# Patient Record
Sex: Female | Born: 1970 | Race: White | Hispanic: No | Smoking: Current every day smoker
Health system: Southern US, Community
[De-identification: ages and names within clinical notes are randomized; demographics above are authoritative.]

## PROBLEM LIST (undated history)

## (undated) DIAGNOSIS — F429 Obsessive-compulsive disorder, unspecified: Secondary | ICD-10-CM

## (undated) DIAGNOSIS — Z8639 Personal history of other endocrine, nutritional and metabolic disease: Secondary | ICD-10-CM

## (undated) DIAGNOSIS — K589 Irritable bowel syndrome without diarrhea: Secondary | ICD-10-CM

## (undated) DIAGNOSIS — M869 Osteomyelitis, unspecified: Secondary | ICD-10-CM

## (undated) DIAGNOSIS — F329 Major depressive disorder, single episode, unspecified: Secondary | ICD-10-CM

## (undated) DIAGNOSIS — Z8661 Personal history of infections of the central nervous system: Secondary | ICD-10-CM

## (undated) HISTORY — DX: Osteomyelitis, unspecified: M86.9

## (undated) HISTORY — PX: CHOLECYSTECTOMY: SHX55

## (undated) HISTORY — DX: Irritable bowel syndrome, unspecified: K58.9

## (undated) HISTORY — PX: TONSILLECTOMY: SUR1361

## (undated) HISTORY — DX: Personal history of other endocrine, nutritional and metabolic disease: Z86.39

## (undated) HISTORY — DX: Major depressive disorder, single episode, unspecified: F32.9

## (undated) HISTORY — DX: Obsessive-compulsive disorder, unspecified: F42.9

## (undated) HISTORY — DX: Personal history of infections of the central nervous system: Z86.61

---

## 1997-08-24 HISTORY — PX: THYROIDECTOMY, PARTIAL: SHX18

## 2002-08-24 HISTORY — PX: OTHER SURGICAL HISTORY: SHX169

## 2005-08-24 HISTORY — PX: PARTIAL HYSTERECTOMY: SHX80

## 2006-12-22 ENCOUNTER — Ambulatory Visit (HOSPITAL_COMMUNITY): Payer: Self-pay | Admitting: Psychiatry

## 2007-01-21 ENCOUNTER — Ambulatory Visit (HOSPITAL_COMMUNITY): Payer: Self-pay | Admitting: Psychiatry

## 2007-04-28 ENCOUNTER — Ambulatory Visit (HOSPITAL_COMMUNITY): Payer: Self-pay | Admitting: Psychiatry

## 2007-06-06 ENCOUNTER — Ambulatory Visit (HOSPITAL_COMMUNITY): Payer: Self-pay | Admitting: Psychiatry

## 2007-07-07 ENCOUNTER — Ambulatory Visit (HOSPITAL_COMMUNITY): Payer: Self-pay | Admitting: Psychiatry

## 2007-08-12 ENCOUNTER — Ambulatory Visit (HOSPITAL_COMMUNITY): Payer: Self-pay | Admitting: Psychiatry

## 2007-09-23 ENCOUNTER — Ambulatory Visit (HOSPITAL_COMMUNITY): Payer: Self-pay | Admitting: Psychiatry

## 2007-12-08 ENCOUNTER — Ambulatory Visit (HOSPITAL_COMMUNITY): Payer: Self-pay | Admitting: Psychiatry

## 2008-03-08 ENCOUNTER — Ambulatory Visit (HOSPITAL_COMMUNITY): Payer: Self-pay | Admitting: Psychiatry

## 2008-06-19 ENCOUNTER — Encounter: Admission: RE | Admit: 2008-06-19 | Discharge: 2008-06-19 | Payer: Self-pay | Admitting: Family Medicine

## 2008-06-19 ENCOUNTER — Ambulatory Visit: Payer: Self-pay | Admitting: Family Medicine

## 2008-06-19 DIAGNOSIS — F429 Obsessive-compulsive disorder, unspecified: Secondary | ICD-10-CM | POA: Insufficient documentation

## 2008-06-19 DIAGNOSIS — R05 Cough: Secondary | ICD-10-CM

## 2008-06-19 DIAGNOSIS — G43909 Migraine, unspecified, not intractable, without status migrainosus: Secondary | ICD-10-CM | POA: Insufficient documentation

## 2008-06-19 DIAGNOSIS — R059 Cough, unspecified: Secondary | ICD-10-CM | POA: Insufficient documentation

## 2008-06-19 DIAGNOSIS — J069 Acute upper respiratory infection, unspecified: Secondary | ICD-10-CM | POA: Insufficient documentation

## 2008-06-20 ENCOUNTER — Encounter: Payer: Self-pay | Admitting: Family Medicine

## 2008-06-25 ENCOUNTER — Ambulatory Visit: Payer: Self-pay | Admitting: Occupational Medicine

## 2008-06-25 DIAGNOSIS — E860 Dehydration: Secondary | ICD-10-CM | POA: Insufficient documentation

## 2008-06-25 LAB — CONVERTED CEMR LAB
CO2: 22 meq/L (ref 19–32)
Chlamydia, Swab/Urine, PCR: NEGATIVE
Chloride: 107 meq/L (ref 96–112)
Creatinine, Ser: 1.2 mg/dL (ref 0.40–1.20)
GC Probe Amp, Urine: NEGATIVE
Glucose, Bld: 112 mg/dL — ABNORMAL HIGH (ref 70–99)
HCV Ab: NEGATIVE
Hemoglobin: 12.8 g/dL (ref 12.0–15.0)
Lymphocytes Relative: 4 % — ABNORMAL LOW (ref 12–46)
Lymphs Abs: 0.4 10*3/uL — ABNORMAL LOW (ref 0.7–4.0)
MCV: 91.9 fL (ref 78.0–100.0)
Potassium: 3.7 meq/L (ref 3.5–5.3)
Sodium: 133 meq/L — ABNORMAL LOW (ref 135–145)
WBC: 10 10*3/uL (ref 4.0–10.5)

## 2008-06-28 ENCOUNTER — Ambulatory Visit: Payer: Self-pay | Admitting: Family Medicine

## 2008-07-16 ENCOUNTER — Ambulatory Visit: Payer: Self-pay | Admitting: Family Medicine

## 2008-09-06 ENCOUNTER — Ambulatory Visit: Payer: Self-pay | Admitting: Family Medicine

## 2008-09-06 DIAGNOSIS — R109 Unspecified abdominal pain: Secondary | ICD-10-CM | POA: Insufficient documentation

## 2008-10-26 ENCOUNTER — Ambulatory Visit: Payer: Self-pay | Admitting: Family Medicine

## 2008-10-26 DIAGNOSIS — J209 Acute bronchitis, unspecified: Secondary | ICD-10-CM | POA: Insufficient documentation

## 2008-11-19 ENCOUNTER — Encounter: Admission: RE | Admit: 2008-11-19 | Discharge: 2008-11-19 | Payer: Self-pay | Admitting: Family Medicine

## 2008-11-19 ENCOUNTER — Ambulatory Visit: Payer: Self-pay | Admitting: Family Medicine

## 2008-11-19 DIAGNOSIS — E039 Hypothyroidism, unspecified: Secondary | ICD-10-CM | POA: Insufficient documentation

## 2008-11-19 DIAGNOSIS — I4949 Other premature depolarization: Secondary | ICD-10-CM | POA: Insufficient documentation

## 2008-11-19 DIAGNOSIS — R079 Chest pain, unspecified: Secondary | ICD-10-CM | POA: Insufficient documentation

## 2008-11-20 ENCOUNTER — Encounter: Payer: Self-pay | Admitting: Family Medicine

## 2008-11-21 LAB — CONVERTED CEMR LAB
Basophils Relative: 1 % (ref 0–1)
Eosinophils Absolute: 0.6 10*3/uL (ref 0.0–0.7)
Eosinophils Relative: 9 % — ABNORMAL HIGH (ref 0–5)
HDL: 83 mg/dL (ref >39)
Hemoglobin: 14 g/dL (ref 12.0–15.0)
LDL Cholesterol: 97 mg/dL (ref 0–99)
Lymphocytes Relative: 37 % (ref 12–46)
Lymphs Abs: 2.6 10*3/uL (ref 0.7–4.0)
MCHC: 34.1 g/dL (ref 30.0–36.0)
Monocytes Relative: 6 % (ref 3–12)
Platelets: 265 10*3/uL (ref 150–400)
TSH: 3.554 microintl units/mL (ref 0.350–4.500)
Total CHOL/HDL Ratio: 2.3
WBC: 7.1 10*3/uL (ref 4.0–10.5)

## 2008-11-28 ENCOUNTER — Ambulatory Visit: Payer: Self-pay | Admitting: Cardiology

## 2008-11-28 ENCOUNTER — Encounter: Payer: Self-pay | Admitting: Cardiology

## 2008-12-12 ENCOUNTER — Ambulatory Visit: Payer: Self-pay

## 2008-12-12 ENCOUNTER — Encounter: Payer: Self-pay | Admitting: Cardiology

## 2009-01-06 ENCOUNTER — Encounter: Payer: Self-pay | Admitting: Family Medicine

## 2009-03-11 ENCOUNTER — Telehealth: Payer: Self-pay | Admitting: Family Medicine

## 2009-03-11 ENCOUNTER — Encounter: Payer: Self-pay | Admitting: Family Medicine

## 2009-04-01 ENCOUNTER — Ambulatory Visit: Payer: Self-pay | Admitting: Family Medicine

## 2009-04-01 DIAGNOSIS — S62609A Fracture of unspecified phalanx of unspecified finger, initial encounter for closed fracture: Secondary | ICD-10-CM | POA: Insufficient documentation

## 2009-04-01 DIAGNOSIS — M546 Pain in thoracic spine: Secondary | ICD-10-CM | POA: Insufficient documentation

## 2009-06-27 ENCOUNTER — Encounter: Admission: RE | Admit: 2009-06-27 | Discharge: 2009-06-27 | Payer: Self-pay | Admitting: Family Medicine

## 2009-06-27 ENCOUNTER — Ambulatory Visit: Payer: Self-pay | Admitting: Family Medicine

## 2009-06-27 DIAGNOSIS — F17200 Nicotine dependence, unspecified, uncomplicated: Secondary | ICD-10-CM | POA: Insufficient documentation

## 2009-08-21 ENCOUNTER — Encounter: Payer: Self-pay | Admitting: Family Medicine

## 2009-09-27 ENCOUNTER — Encounter: Payer: Self-pay | Admitting: Family Medicine

## 2009-10-22 IMAGING — CR DG CHEST 2V
2 series · 2 of 2 positions shown · non-contrast
Comparison: 06/25/2008

CLINICAL DATA: Chest pain, shortness of breath and cough.

CHEST - 2 VIEW

[view not recorded (1 of 2)]
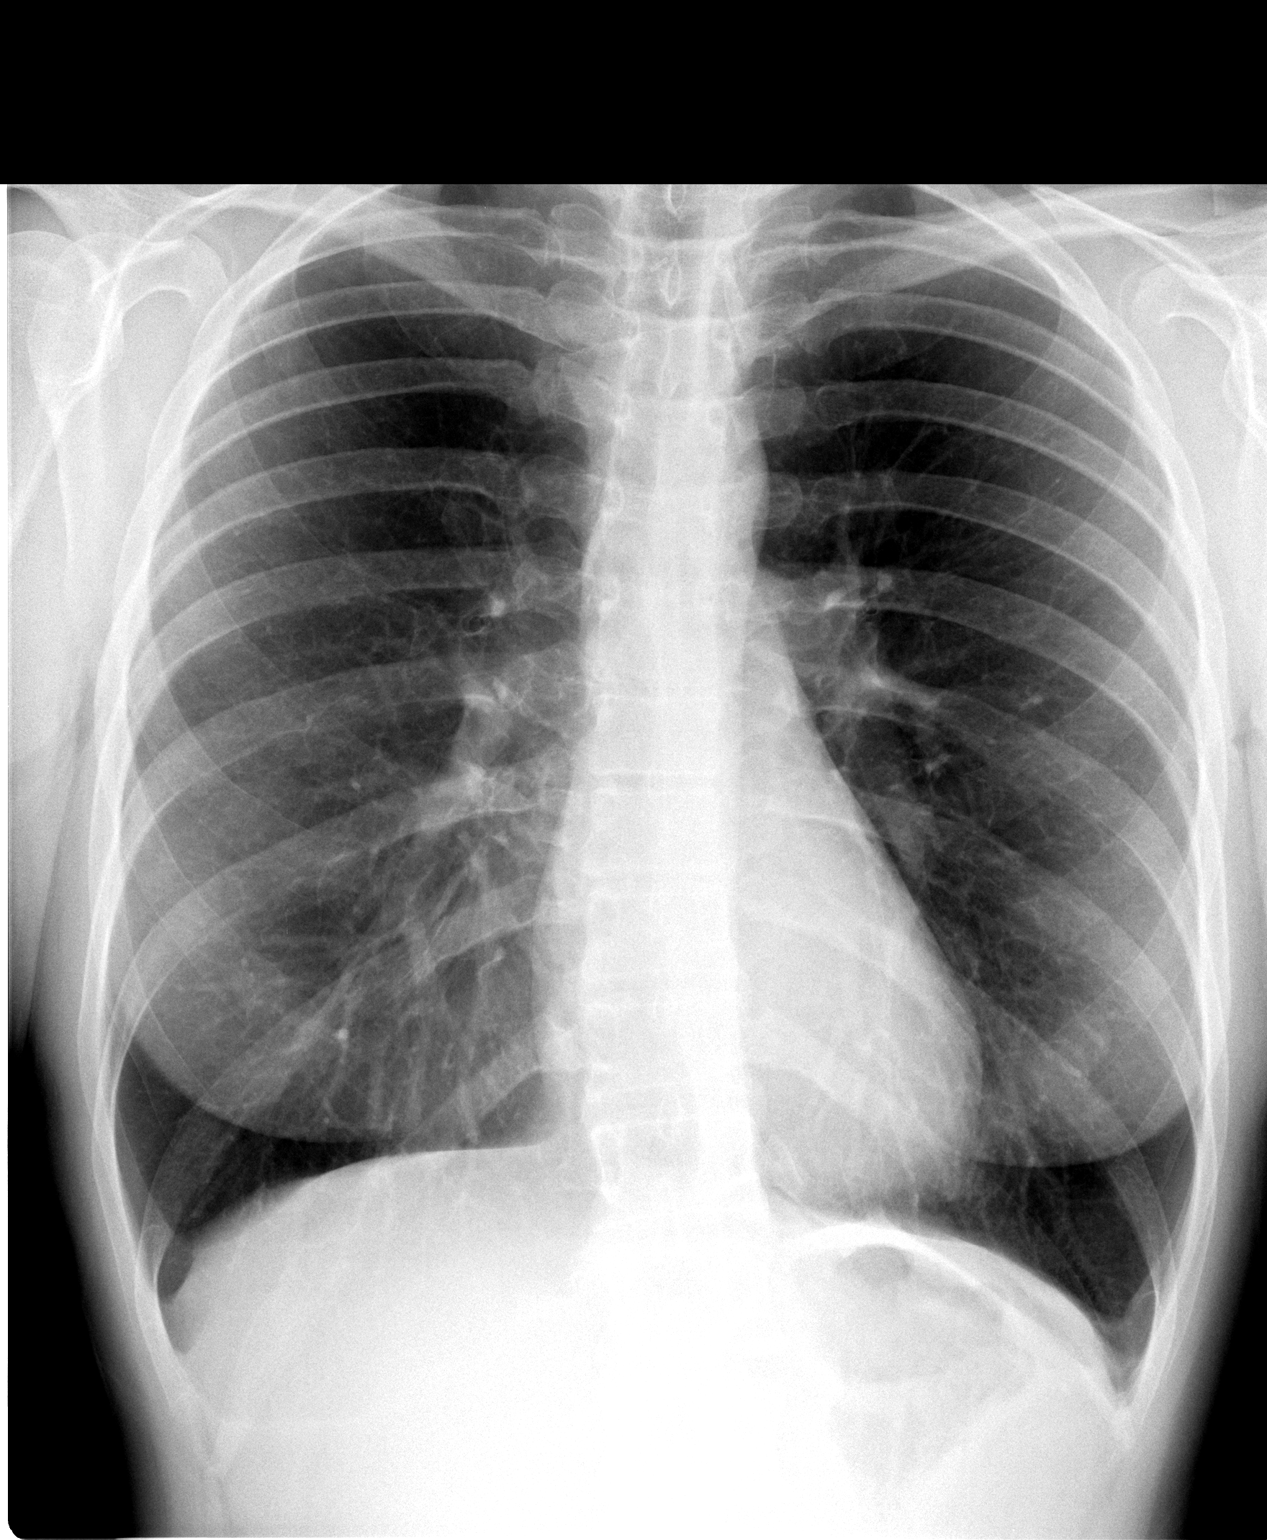

[view not recorded (2 of 2)]
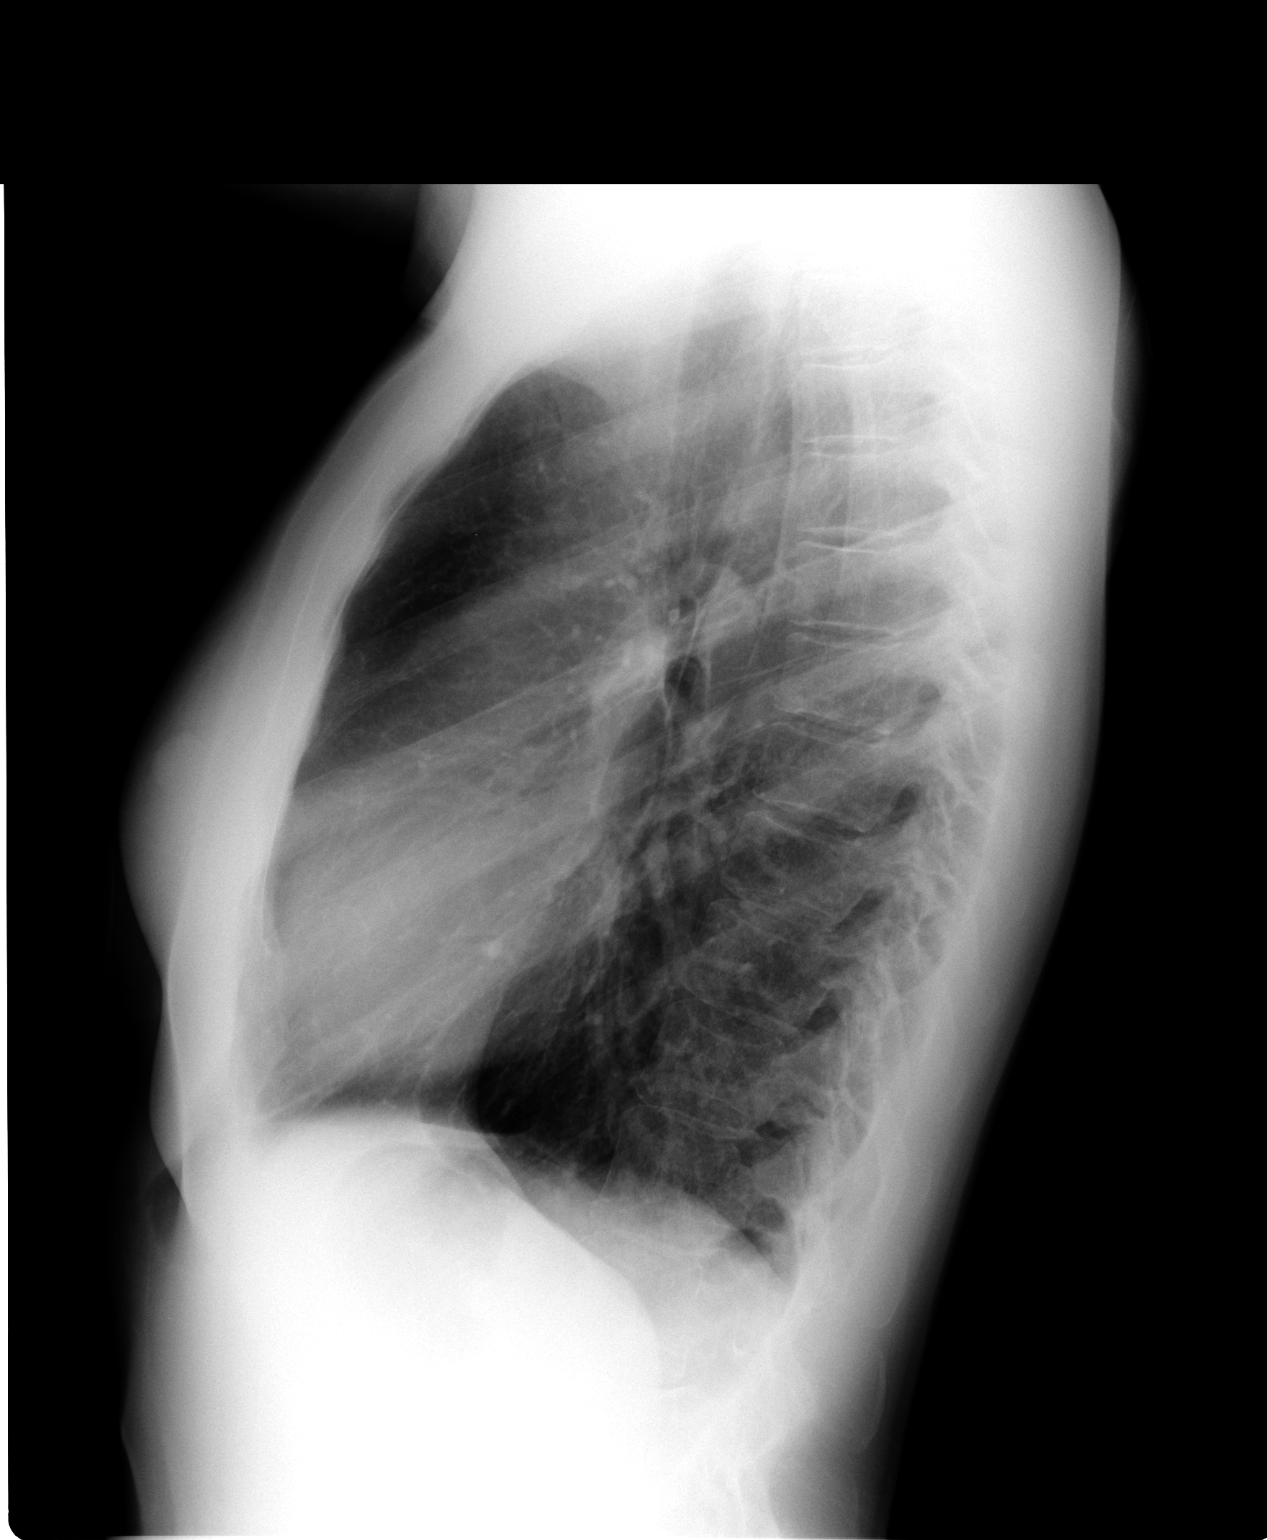

[2 of 2 positions shown; findings below may reference images not displayed]

FINDINGS: Trachea is midline.  Heart size normal.  Lungs are clear.
No pleural fluid.
IMPRESSION: No acute findings.

## 2009-12-25 ENCOUNTER — Encounter: Payer: Self-pay | Admitting: Family Medicine

## 2009-12-31 ENCOUNTER — Ambulatory Visit (HOSPITAL_COMMUNITY): Payer: Self-pay | Admitting: Psychiatry

## 2010-01-08 ENCOUNTER — Encounter: Payer: Self-pay | Admitting: Family Medicine

## 2010-02-13 ENCOUNTER — Ambulatory Visit (HOSPITAL_COMMUNITY): Payer: Self-pay | Admitting: Psychiatry

## 2010-03-21 ENCOUNTER — Ambulatory Visit: Payer: Self-pay | Admitting: Family Medicine

## 2010-03-21 DIAGNOSIS — M5430 Sciatica, unspecified side: Secondary | ICD-10-CM | POA: Insufficient documentation

## 2010-03-28 ENCOUNTER — Telehealth (INDEPENDENT_AMBULATORY_CARE_PROVIDER_SITE_OTHER): Payer: Self-pay | Admitting: *Deleted

## 2010-04-08 ENCOUNTER — Ambulatory Visit: Payer: Self-pay | Admitting: Family Medicine

## 2010-04-08 DIAGNOSIS — IMO0002 Reserved for concepts with insufficient information to code with codable children: Secondary | ICD-10-CM | POA: Insufficient documentation

## 2010-04-12 ENCOUNTER — Encounter: Admission: RE | Admit: 2010-04-12 | Discharge: 2010-04-12 | Payer: Self-pay | Admitting: Family Medicine

## 2010-04-21 ENCOUNTER — Ambulatory Visit: Payer: Self-pay | Admitting: Family Medicine

## 2010-04-21 DIAGNOSIS — R1031 Right lower quadrant pain: Secondary | ICD-10-CM | POA: Insufficient documentation

## 2010-04-21 LAB — CONVERTED CEMR LAB
Blood in Urine, dipstick: NEGATIVE
Glucose, Urine, Semiquant: NEGATIVE
HCT: 38.7 % (ref 36.0–46.0)
Hemoglobin: 13 g/dL (ref 12.0–15.0)
Ketones, urine, test strip: NEGATIVE
MCHC: 33.6 g/dL (ref 30.0–36.0)
Monocytes Relative: 7 % (ref 3–12)
Neutro Abs: 3.3 10*3/uL (ref 1.7–7.7)
Neutrophils Relative %: 54 % (ref 43–77)
Nitrite: NEGATIVE
Specific Gravity, Urine: >=1.03
WBC Urine, dipstick: NEGATIVE

## 2010-04-22 LAB — CONVERTED CEMR LAB
ALT: 9 units/L (ref 0–35)
Alkaline Phosphatase: 38 units/L — ABNORMAL LOW (ref 39–117)
BUN: 15 mg/dL (ref 6–23)
Calcium: 9 mg/dL (ref 8.4–10.5)
Chloride: 113 meq/L — ABNORMAL HIGH (ref 96–112)
Creatinine, Ser: 0.84 mg/dL (ref 0.40–1.20)
Total Bilirubin: 0.4 mg/dL (ref 0.3–1.2)
Total Protein: 6 g/dL (ref 6.0–8.3)

## 2010-04-23 ENCOUNTER — Telehealth: Payer: Self-pay | Admitting: Family Medicine

## 2010-05-16 ENCOUNTER — Ambulatory Visit (HOSPITAL_COMMUNITY): Payer: Self-pay | Admitting: Psychiatry

## 2010-06-25 ENCOUNTER — Ambulatory Visit (HOSPITAL_COMMUNITY): Payer: Self-pay | Admitting: Psychiatry

## 2010-07-29 ENCOUNTER — Ambulatory Visit (HOSPITAL_COMMUNITY): Payer: Self-pay | Admitting: Psychiatry

## 2010-09-08 ENCOUNTER — Encounter: Payer: Self-pay | Admitting: Family Medicine

## 2010-09-23 NOTE — Letter (Signed)
Summary: [facility]  [facility]   Imported By: Lanelle Bal 01/23/2010 10:43:51  _____________________________________________________________________  External Attachment:    Type:   Image     Comment:   External Document

## 2010-09-23 NOTE — Progress Notes (Signed)
Summary: pain medication  Phone Note Call from Patient   Caller: Patient Summary of Call: pt wants a refill of pain medication to get through the weekend. States she is going out of town Initial call taken by: Avon Gully CMA, Duncan Dull),  March 28, 2010 11:22 AM  Follow-up for Phone Call        This isnt my patient but I will refill her med for 20 more tab and then needs to see Dr. B back.  Follow-up by: Nani Gasser MD,  March 28, 2010 12:04 PM  Additional Follow-up for Phone Call Additional follow up Details #1::        Pt aware of the above    Prescriptions: HYDROCODONE-ACETAMINOPHEN 5-325 MG TABS (HYDROCODONE-ACETAMINOPHEN) 1-2 tabs by  mouth eveyr 4-6 hours as needed severe pain.  #20 x 0   Entered and Authorized by:   Nani Gasser MD   Signed by:   Nani Gasser MD on 03/28/2010   Method used:   Printed then faxed [facility] ...       [facility]  [address] [phone]* (retail)       [address] [place], [place]  [address]       Ph: [phone] or [phone]       Fax: [phone]   RxID:   [id number]

## 2010-09-23 NOTE — Assessment & Plan Note (Signed)
Summary: lumbar radiculopathy   Vital Signs:  Patient profile:   40 year old female Height:      70 inches Weight:      148 pounds BMI:     21.31 O2 Sat:      100 % on Room air Pulse rate:   90 / minute BP sitting:   107 / 75  (left arm) Cuff size:   regular  Vitals Entered By: Payton Spark CMA (April 08, 2010 10:05 AM)  O2 Flow:  Room air CC: F/U back and leg pain x 3 weeks.   Primary Care Provider:  Seymour Bars DO  CC:  F/U back and leg pain x 3 weeks.Marland Kitchen  History of Present Illness: 40 yo WF presents for f/u R sided sciatic x 3 wks which started after moving furniture.  She has not improved at all even with Vicodin, Aleve and Flexeril.  She has chronic LBP, had seen a chiropractor in the past and had Xrays done.  These wer normal other than scoliosis.  She has pain with sitting and driving.  Has radiating pain from the R buttock and hip all the way down to her foot.  Denies tingling.  Has some mild R foot numbness.  She is walking with an altered gait since this happened.  She is waking up at night with pain.      Current Medications (verified): 1)  Fluvoxamine Maleate 100 Mg Tabs (Fluvoxamine Maleate) .Marland Kitchen.. 1 Tab By Mouth Bid 2)  Topamax 100 Mg Tabs (Topiramate) .... Take 1&1/2 By Mouth Daily 3)  Levoxyl 50 Mcg Tabs (Levothyroxine Sodium) .Marland Kitchen.. 1 Tab By Mouth Daily 4)  Multivitamins  Tabs (Multiple Vitamin) .... Take 1 Tablet By Mouth Once A Day 5)  Wellbutrin Xl 150 Mg Xr24h-Tab (Bupropion Hcl) .Marland Kitchen.. 1 Tab By Mouth Once Daily 6)  Cyclobenzaprine Hcl 10 Mg Tabs (Cyclobenzaprine Hcl) .... Take 1 Tablet By Mouth Three Times A Day As Needed 7)  Hydrocodone-Acetaminophen 5-325 Mg Tabs (Hydrocodone-Acetaminophen) .Marland Kitchen.. 1-2 Tabs By  Mouth Eveyr 4-6 Hours As Needed Severe Pain.  Allergies (verified): 1)  ! Codeine 2)  ! * Avelox  Past History:  Past Medical History: Reviewed history from 11/28/2008 and no changes required. hx of hypothyroidism, off meds G3P2A1 OCD (Dr  Christell Constant) Migraines (Dr Catalina Lunger at the [facility]) gyn: Dr Darcey Nora H/O meningitis IBS  Past Surgical History: Reviewed history from 06/25/2008 and no changes required. partial thyroidectomy 1999 BTL 2004 partial TAH 2007 for prolapse Tonsillectomy  Social History: Reviewed history from 12/28/2008 and no changes required. 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 Smokes 1/2 ppd x 11 yrs. 1 ETOH/ wk. No regular exercise.  Review of Systems      See HPI  Physical Exam  General:  alert, well-developed, well-nourished, and well-hydrated.   Head:  normocephalic and atraumatic.   Neck:  supple.   Lungs:  Normal respiratory effort, chest expands symmetrically. Lungs are clear to auscultation, no crackles or wheezes. Heart:  normal rate, regular rhythm, and no murmur.   Msk:  tender at L 4-S1 R>L with sciatic notch tenderness.  pain with L spine flexion and extension.  + R sided seated leg raise with resisted strength on R hip flexion/ R leg extension Pulses:  2+ pedal pulses Extremities:  no LE edema Neurologic:  antagic gaitsensation intact to light touch and DTRs symmetrical and normal.   Skin:  color normal.  Impression & Recommendations:  Problem # 1:  LUMBAR RADICULOPATHY, RIGHT (ICD-724.4) Not improved after 3 wks of Aleve, Flexeril and Vicodin with antalgic gait.  Hx and PE c/w lumbar radiculopathy with distant hx of LESI, chronic LBP hx.  Has seen chiropractor in the past and had a normal L spine Xray.  Will need MRI for further eval.  Change meds to Percocet + gabentin.  May need to see Dr Jordan Likes for LESIs/ pain mgmt vs surgery. The following medications were removed from the medication list:    Hydrocodone-acetaminophen 5-325 Mg Tabs (Hydrocodone-acetaminophen) .Marland Kitchen... 1-2 tabs by  mouth eveyr 4-6 hours as needed severe pain. Her updated medication list for this problem includes:    Cyclobenzaprine Hcl 10 Mg Tabs  (Cyclobenzaprine hcl) .Marland Kitchen... Take 1 tablet by mouth three times a day as needed    Oxycodone-acetaminophen 5-325 Mg Tabs (Oxycodone-acetaminophen) .Marland Kitchen... 1 tab by mouth three times a day as needed severe back pain  Orders: T-MRI Lumbar spine w/o contrast (04540)  Complete Medication List: 1)  Fluvoxamine Maleate 100 Mg Tabs (Fluvoxamine maleate) .Marland Kitchen.. 1 tab by mouth bid 2)  Topamax 100 Mg Tabs (Topiramate) .... Take 1&1/2 by mouth daily 3)  Levoxyl 50 Mcg Tabs (Levothyroxine sodium) .Marland Kitchen.. 1 tab by mouth daily 4)  Multivitamins Tabs (Multiple vitamin) .... Take 1 tablet by mouth once a day 5)  Wellbutrin Xl 150 Mg Xr24h-tab (Bupropion hcl) .Marland Kitchen.. 1 tab by mouth once daily 6)  Cyclobenzaprine Hcl 10 Mg Tabs (Cyclobenzaprine hcl) .... Take 1 tablet by mouth three times a day as needed 7)  Gabapentin 300 Mg Caps (Gabapentin) .Marland Kitchen.. 1 capsule by mouth at bedtime x 1 wk then 2 capulses by mouth qhs 8)  Oxycodone-acetaminophen 5-325 Mg Tabs (Oxycodone-acetaminophen) .Marland Kitchen.. 1 tab by mouth three times a day as needed severe back pain  Patient Instructions: 1)  Change Hydrocodone (vicodin) to Oxycodone (percocet)-- this may make you sleepy. 2)  Stay on Aleve two times a day with food. 3)  Add Gabapentin at night for nerve root pain. 4)  Set up MRI L Spine this wk. 5)  Will call you w/ results. 6)  REturn for follow up in 2 wks. Prescriptions: OXYCODONE-ACETAMINOPHEN 5-325 MG TABS (OXYCODONE-ACETAMINOPHEN) 1 tab by mouth three times a day as needed severe back pain  #50 x 0   Entered and Authorized by:   Seymour Bars DO   Signed by:   Seymour Bars DO on 04/08/2010   Method used:   Print then Give to Patient   RxID:   [id number] GABAPENTIN 300 MG CAPS (GABAPENTIN) 1 capsule by mouth at bedtime x 1 wk then 2 capulses by mouth qhs  #60 x 0   Entered and Authorized by:   Seymour Bars DO   Signed by:   Seymour Bars DO on 04/08/2010   Method used:   [facility] [phone][facility]  (retail)       [address]       [place], [place]  [address]       Ph: [phone] or [phone]       Fax: [phone]   RxID:   [id number]

## 2010-09-23 NOTE — Progress Notes (Signed)
Summary: meds  Phone Note Call from Patient   Caller: Patient Call For: Dr. Linford Arnold Summary of Call: pt was rx'ed Tramadol and she states it makes her vomit.[facility] Initial call taken by: Avon Gully CMA, Duncan Dull),  April 23, 2010 10:38 AM  Follow-up for Phone Call        Pt notified. Follow-up by: Kathlene November,  April 23, 2010 11:02 AM   New Allergies: TRAMADOL HCL (TRAMADOL HCL) New/Updated Medications: HYDROCODONE-ACETAMINOPHEN 5-325 MG TABS (HYDROCODONE-ACETAMINOPHEN) one by mouth every 4-6 hours as needed. New Allergies: TRAMADOL HCL (TRAMADOL HCL)Prescriptions: HYDROCODONE-ACETAMINOPHEN 5-325 MG TABS (HYDROCODONE-ACETAMINOPHEN) one by mouth every 4-6 hours as needed.  #12 x 0   Entered and Authorized by:   Nani Gasser MD   Signed by:   Nani Gasser MD on 04/23/2010   Method used:   Printed then faxed to ...       [facility]  [address] [phone]* (retail)       [address] [place], [place]  [address]       Ph: [phone] or [phone]       Fax: [phone]   RxID:   [id number]

## 2010-09-23 NOTE — Assessment & Plan Note (Signed)
Summary: lower back pain that radiates in her hips- jr   Vital Signs:  Patient profile:   40 year old female Height:      70 inches Weight:      149 pounds BMI:     21.46 Pulse rate:   73 / minute BP sitting:   117 / 79  (left arm) Cuff size:   regular  Vitals Entered By: Avon Gully CMA, Duncan Dull) (March 21, 2010 11:16 AM) CC: lower back pain since last week radiated to rt hip and rt leg   Primary Care Provider:  Seymour Bars DO  CC:  lower back pain since last week radiated to rt hip and rt leg.  History of Present Illness: lower back pain since last week radiated to rt hip and rt leg.  Hx of chronic back pain.  WAs  moving furniture last week and feels this may have irriated her back.  Usually afer a couple of days of rest will ussually feel betters but not this time. Usually radiates into her right buttock but now radiates below the knee, laterally. Worse with sitting and standing. Taking Aleve and not helping much.  Feels her back catching when moves.  using a Heating pad as well.  Pain is 8/10.    Current Medications (verified): 1)  Fluvoxamine Maleate 100 Mg Tabs (Fluvoxamine Maleate) .Marland Kitchen.. 1 Tab By Mouth Bid 2)  Topamax 100 Mg Tabs (Topiramate) .... Take 1&1/2 By Mouth Daily 3)  Levoxyl 50 Mcg Tabs (Levothyroxine Sodium) .Marland Kitchen.. 1 Tab By Mouth Daily 4)  Multivitamins  Tabs (Multiple Vitamin) .... Take 1 Tablet By Mouth Once A Day 5)  Wellbutrin Xl 150 Mg Xr24h-Tab (Bupropion Hcl) .Marland Kitchen.. 1 Tab By Mouth Once Daily  Allergies (verified): 1)  ! Codeine 2)  ! * Avelox  Comments:  Nurse/Medical Assistant: The patient's medications and allergies were reviewed with the patient and were updated in the Medication and Allergy Lists. Avon Gully CMA, Duncan Dull) (March 21, 2010 11:17 AM)  Physical Exam  General:  Well-developed,well-nourished,in no acute distress; alert,appropriate and cooperative throughout examination Head:  Normocephalic and atraumatic without obvious  abnormalities. No apparent alopecia or balding. Msk:  Dec felxion to about 45 degrees. Almost no extension. Dec rotation at the waist and side bendig.  + straight leg raise.  reflexes 2+ bilat. Hip, knee and ankle stength 5/5. Lumbar spine nontender. + tenderness of teh rigth SI joint.   Skin:  no rashes.     Impression & Recommendations:  Problem # 1:  SCIATICA (ICD-724.3)  Her updated medication list for this problem includes:    Cyclobenzaprine Hcl 10 Mg Tabs (Cyclobenzaprine hcl) .Marland Kitchen... Take 1 tablet by mouth three times a day as needed    Hydrocodone-acetaminophen 5-325 Mg Tabs (Hydrocodone-acetaminophen) .Marland Kitchen... 1-2 tabs by  mouth eveyr 4-6 hours as needed severe pain.  Discussed use of moist heat or ice, modified activities, medications, and stretching/strengthening exercises. Back care instructions given. To be seen in 2 weeks if no improvement; sooner if worsening of symptoms.   Complete Medication List: 1)  Fluvoxamine Maleate 100 Mg Tabs (Fluvoxamine maleate) .Marland Kitchen.. 1 tab by mouth bid 2)  Topamax 100 Mg Tabs (Topiramate) .... Take 1&1/2 by mouth daily 3)  Levoxyl 50 Mcg Tabs (Levothyroxine sodium) .Marland Kitchen.. 1 tab by mouth daily 4)  Multivitamins Tabs (Multiple vitamin) .... Take 1 tablet by mouth once a day 5)  Wellbutrin Xl 150 Mg Xr24h-tab (Bupropion hcl) .Marland Kitchen.. 1 tab by mouth once daily 6)  Cyclobenzaprine  Hcl 10 Mg Tabs (Cyclobenzaprine hcl) .... Take 1 tablet by mouth three times a day as needed 7)  Hydrocodone-acetaminophen 5-325 Mg Tabs (Hydrocodone-acetaminophen) .Marland Kitchen.. 1-2 tabs by  mouth eveyr 4-6 hours as needed severe pain.  Other Orders: Ketorolac-Toradol 15mg  (Z6109) Admin of Therapeutic Inj  intramuscular or subcutaneous (60454)  Patient Instructions: 1)  Aleve two times a day with food 2)  Can start hydrocodone as needed for sever pain.   3)  Can use flexeril. These are sedating to do not drive while on these.  4)  Can start the exercises once feels better.     Prescriptions: HYDROCODONE-ACETAMINOPHEN 5-325 MG TABS (HYDROCODONE-ACETAMINOPHEN) 1-2 tabs by  mouth eveyr 4-6 hours as needed severe pain.  #30 x 0   Entered and Authorized by:   Nani Gasser MD   Signed by:   Nani Gasser MD on 03/21/2010   Method used:   Printed then faxed to ...       [facility]  [address] [phone]* (retail)       [address] [place], [place]  [address]       Ph: [phone] or [phone]       Fax: [phone]   RxID:   [id number] CYCLOBENZAPRINE HCL 10 MG TABS (CYCLOBENZAPRINE HCL) Take 1 tablet by mouth three times a day as needed  #30 x 0   Entered and Authorized by:   Nani Gasser MD   Signed by:   Nani Gasser MD on 03/21/2010   Method used:   [facility] to        [facility] [phone]* (retail)       [address]       [place], [place]  [address]       Ph: [phone] or [phone]       Fax: [phone]   RxID:   [id number]    Medication Administration  Injection # 1:    Medication: Ketorolac-Toradol 15mg     Diagnosis: BACK PAIN, THORACIC REGION (ICD-724.1)    Route: IM    Site: L thigh    Exp Date: 07/25/2011    Lot #: [id number]    Mfr: Hospira    Comments: Toradol 60mg  given IM    Patient tolerated injection without complications    Given by: Kathlene November (March 21, 2010 12:01 PM)  Orders Added: 1)  Ketorolac-Toradol 15mg  [J1885] 2)  Admin of Therapeutic Inj  intramuscular or subcutaneous [96372] 3)  Est. Patient Level IV [16073]

## 2010-09-23 NOTE — Consult Note (Signed)
Summary: [facility]  [facility]   Imported By: Lanelle Bal 01/09/2010 08:29:53  _____________________________________________________________________  External Attachment:    Type:   Image     Comment:   External Document

## 2010-09-23 NOTE — Assessment & Plan Note (Signed)
Summary: RLQ pain   Vital Signs:  Patient profile:   40 year old female Height:      70 inches Weight:      151 pounds Temp:     98.6 degrees F oral Pulse rate:   79 / minute BP sitting:   116 / 72  (left arm) Cuff size:   regular  Vitals Entered By: Kathlene November (April 21, 2010 3:55 PM) CC: vomiting off and on on Saturday- then fever saturday night and sunday and today lower right sided abdominal pain and nausea   Primary Care Daisy Mcneel:  Seymour Bars DO  CC:  vomiting off and on on Saturday- then fever saturday night and sunday and today lower right sided abdominal pain and nausea.  History of Present Illness: Patient reports on saturday that she was vomitting and had nausea all day and began running a fever of 102.  She denies diarrhea, blood in her stools  or constipation during this episode but endorses right lower quadrant pain started saturday night and still hurts to the point that she could not lay on her right side because she would be awakened by the pain. On Friday, the patient and her boyfriend shared some [place] food and shared breakfast on Saturday morning but he did not become sick.  Today, her stools are clay-colored to yellowish which began last night. No pain relief medications.     She denies polyruia, straining, dysuuria or hematuria but endorses going less frequently than normal.    The patient had a parital hysterectomy in 2007.    Current Medications (verified): 1)  Fluvoxamine Maleate 100 Mg Tabs (Fluvoxamine Maleate) .Marland Kitchen.. 1 Tab By Mouth Bid 2)  Topamax 100 Mg Tabs (Topiramate) .... Take 1&1/2 By Mouth Daily 3)  Levoxyl 50 Mcg Tabs (Levothyroxine Sodium) .Marland Kitchen.. 1 Tab By Mouth Daily 4)  Multivitamins  Tabs (Multiple Vitamin) .... Take 1 Tablet By Mouth Once A Day 5)  Gabapentin 300 Mg Caps (Gabapentin) .Marland Kitchen.. 1 Capsule By Mouth At Bedtime X 1 Wk Then 2 Capulses By Mouth Qhs  Allergies (verified): 1)  ! Codeine 2)  ! * Avelox  Comments:  Nurse/Medical  Assistant: The patient's medications and allergies were reviewed with the patient and were updated in the Medication and Allergy Lists. Kathlene November (April 21, 2010 3:57 PM)  Past History:  Past Surgical History: Last updated: 06/25/2008 partial thyroidectomy 1999 BTL 2004 partial TAH 2007 for prolapse Tonsillectomy  Social History: Reviewed history from 12/28/2008 and no changes required. 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 Smokes 1/2 ppd x 11 yrs. 1 ETOH/ wk. No regular exercise.  Physical Exam  General:  alert, well-developed, and well-nourished.   Lungs:  Normal respiratory effort, chest expands symmetrically. Lungs are clear to auscultation, no crackles or wheezes. Heart:  normal rate, regular rhythm, no murmur, no gallop, and no rub.   Abdomen:  Abdomen is soft and normal bowel sounds.   Abdomen is tender  in the right lower quadrant, but no guarding or rebound tenderness.  No pain to deep palpation in the RUQ.  No Hepatosplenomegaly. Pulses:  RAdial 2+  Skin:  no rashes.   Psych:  normally interactive and good eye contact.  normally interactive and good eye contact.     Impression & Recommendations:  Problem # 1:  RLQ PAIN (ICD-789.03) Cnsider ovarian cyst vx appendicitis vs GI bug (colitis). She does feel a little better today which is  reassuring.  Willget stat CBC to rule out infection.  Stat CBC was normal. Will tx pain with tramadol and if pain worsens go to ED or if fever go to ED.  Will call iwth CMP resutls tomrrow and to check on her.  UA is neg to UTI or pyelo is less likely. Also consider kidney stones but pain has not migrated over the last few days and no blood in the urine.  Orders: T-CBC w/Diff ([id number]) T-Comprehensive Metabolic Panel (09811-91478) UA Dipstick w/o Micro (automated)  (81003)  Complete Medication List: 1)  Fluvoxamine Maleate 100 Mg Tabs (Fluvoxamine maleate) .Marland Kitchen.. 1 tab by mouth  bid 2)  Topamax 100 Mg Tabs (Topiramate) .... Take 1&1/2 by mouth daily 3)  Levoxyl 50 Mcg Tabs (Levothyroxine sodium) .Marland Kitchen.. 1 tab by mouth daily 4)  Multivitamins Tabs (Multiple vitamin) .... Take 1 tablet by mouth once a day 5)  Gabapentin 300 Mg Caps (Gabapentin) .Marland Kitchen.. 1 capsule by mouth at bedtime x 1 wk then 2 capulses by mouth qhs 6)  Ultram 50 Mg Tabs (Tramadol hcl) .... Take 1 tablet by mouth three times a day Prescriptions: ULTRAM 50 MG TABS (TRAMADOL HCL) Take 1 tablet by mouth three times a day  #20 x 0   Entered and Authorized by:   Nani Gasser MD   Signed by:   Nani Gasser MD on 04/21/2010   Method used:   [facility] to        [facility] [phone]* (retail)       [address] [place], [place]  [address]       Ph: [phone] or [phone]       Fax: [phone]   RxID:   [id number]   Laboratory Results   Urine Tests  Date/Time Received: 04/21/2010 Date/Time Reported: 04/21/2010  Routine Urinalysis   Color: yellow Appearance: Clear Glucose: negative   (Normal Range: Negative) Bilirubin: negative   (Normal Range: Negative) Ketone: negative   (Normal Range: Negative) Spec. Gravity: >=1.030   (Normal Range: 1.003-1.035) Blood: negative   (Normal Range: Negative) pH: 6.5   (Normal Range: 5.0-8.0) Protein: trace   (Normal Range: Negative) Urobilinogen: 0.2   (Normal Range: 0-1) Nitrite: negative   (Normal Range: Negative) Leukocyte Esterace: negative   (Normal Range: Negative)

## 2010-09-23 NOTE — Letter (Signed)
Summary: [facility]   Imported By: Lanelle Bal 10/04/2009 11:24:48  _____________________________________________________________________  External Attachment:    Type:   Image     Comment:   External Document

## 2010-09-23 NOTE — Letter (Signed)
Summary: [facility]   Imported By: Lanelle Bal 09/17/2009 10:29:16  _____________________________________________________________________  External Attachment:    Type:   Image     Comment:   External Document

## 2010-09-24 ENCOUNTER — Encounter: Payer: Self-pay | Admitting: Family Medicine

## 2010-09-24 ENCOUNTER — Ambulatory Visit (INDEPENDENT_AMBULATORY_CARE_PROVIDER_SITE_OTHER): Payer: BC Managed Care – PPO | Admitting: Family Medicine

## 2010-09-24 DIAGNOSIS — R05 Cough: Secondary | ICD-10-CM

## 2010-09-24 DIAGNOSIS — R059 Cough, unspecified: Secondary | ICD-10-CM

## 2010-09-24 DIAGNOSIS — R0602 Shortness of breath: Secondary | ICD-10-CM

## 2010-09-24 DIAGNOSIS — J189 Pneumonia, unspecified organism: Secondary | ICD-10-CM | POA: Insufficient documentation

## 2010-10-01 ENCOUNTER — Encounter (HOSPITAL_COMMUNITY): Payer: Self-pay | Admitting: Psychiatry

## 2010-10-01 NOTE — Letter (Signed)
Summary: [facility] [place]   Imported By: Lanelle Bal 09/26/2010 09:05:39  _____________________________________________________________________  External Attachment:    Type:   Image     Comment:   External Document

## 2010-10-01 NOTE — Letter (Signed)
Summary: Out of Work  [facility]  [address]   [place], [place] [address]   Phone: [phone]  Fax: [phone]    September 24, 2010   Employee:  TERICA YOGI    To Whom It May Concern:   For Medical reasons, please excuse the above named employee from work for the following dates:  Start:   Feb 1st - 3rd  End:   Feb 4th  If you need additional information, please feel free to contact our office.         Sincerely,    Seymour Bars DO

## 2010-10-01 NOTE — Assessment & Plan Note (Signed)
Summary: pneumonia   Vital Signs:  Patient profile:   40 year old female Height:      70 inches Weight:      134 pounds BMI:     19.30 O2 Sat:      98 % on Room air Temp:     98.5 degrees F oral Pulse rate:   110 / minute BP sitting:   116 / 78  (left arm) Cuff size:   regular  Vitals Entered By: Payton Spark CMA (September 24, 2010 1:32 PM)  O2 Flow:  Room air CC: Cough and back pain   Primary Care Provider:  Seymour Bars DO  CC:  Cough and back pain.  History of Present Illness: 40 yo WF presents for cough and back pain that started yesterday.  She is having bodyaches all over.  She started with the 'flu' 2 wks ago and now has pleuritic R anterior rib pain.  She has had pneumonia in the past.  She has cut back on smoking.  She has felt a little SOB.  She feels tired and weak all over.  She has nausea today, no vomitting.  Has HA and lethargy today.      Current Medications (verified): 1)  Fluvoxamine Maleate 100 Mg Tabs (Fluvoxamine Maleate) .Marland Kitchen.. 1 Tab By Mouth Bid 2)  Topamax 100 Mg Tabs (Topiramate) .... Take 1&1/2 By Mouth Daily 3)  Levoxyl 50 Mcg Tabs (Levothyroxine Sodium) .Marland Kitchen.. 1 Tab By Mouth Daily 4)  Multivitamins  Tabs (Multiple Vitamin) .... Take 1 Tablet By Mouth Once A Day  Allergies (verified): 1)  ! Codeine 2)  ! * Avelox 3)  Tramadol Hcl (Tramadol Hcl)  Past History:  Past Medical History: Reviewed history from 11/28/2008 and no changes required. hx of hypothyroidism, off meds G3P2A1 OCD (Dr Christell Constant) Migraines (Dr Catalina Lunger at the [facility]) gyn: Dr Darcey Nora H/O meningitis IBS  Social History: Reviewed history from 12/28/2008 and no changes required. 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 Smokes 1/2 ppd x 11 yrs. 1 ETOH/ wk. No regular exercise.  Review of Systems      See HPI  Physical Exam  General:  alert, well-developed, well-nourished, and well-hydrated.   Eyes:  conjunctiva  clear Nose:  no nasal discharge.   Mouth:  good dentition and pharynx pink and moist.   Neck:  no masses.   Chest Wall:  R anterior lower ribs TTP Lungs:  decreased air movement over the L bases, nonlabored other than splinting on deep inspiration, no W/R/R or accessory muscle use Heart:  regular rhythm and tachycardia.   Extremities:  no LE edema Neurologic:  no nuchal rigidity Skin:  color normal.   Psych:  good eye contact, not anxious appearing, and flat affect.     Impression & Recommendations:  Problem # 1:  PNEUMONIA (ICD-486)  Will empirically treat for probable pneumonia given hx of pneumonia several x's in the past, preceeding flu/ cold virus followed my improvement x 2 days then clinically worsened with malaise, right rib pain, splinting, cough and nausea.  Will treat with Rocephin 1 g IM + Zithromax x 5 days, Hycodan for cough, Mucinex and Advil for supportive care.  avoid smoking.  REst, hydrate and call if any worsening. ow/ f/u in 1 wk. Her updated medication list for this problem includes:    Zithromax Z-pak 250 Mg Tabs (Azithromycin) .Marland Kitchen... Take as directed  Orders: Rocephin  250mg  (  H4742) Admin of Therapeutic Inj  intramuscular or subcutaneous (59563)  Complete Medication List: 1)  Fluvoxamine Maleate 100 Mg Tabs (Fluvoxamine maleate) .Marland Kitchen.. 1 tab by mouth bid 2)  Topamax 100 Mg Tabs (Topiramate) .... Take 1&1/2 by mouth daily 3)  Levoxyl 50 Mcg Tabs (Levothyroxine sodium) .Marland Kitchen.. 1 tab by mouth daily 4)  Multivitamins Tabs (Multiple vitamin) .... Take 1 tablet by mouth once a day 5)  Hydrocodone-homatropine 5-1.5 Mg Tabs (Hydrocodone-homatropine) .... 5 ml by mouth q 6 hrs as needed cough 6)  Zithromax Z-pak 250 Mg Tabs (Azithromycin) .... Take as directed  Patient Instructions: 1)  For suspected pneumonia: 2)  Rocephin 1 gram IM today. 3)  Start Zithromax today and take x 5 days. 4)  Use RX cough syrup every 6 hrs.  This will make you sleepy. 5)  Add OTC plain  Mucinex every 12 hrs 6)  Add Ibuprofen 3-4 tabs up to 3 x a day for fevers/ bodyaches/ 7)  Avoid smoking. 8)  REst, hydrate. 9)  Call if any problems. 10)  REturn in 1 wk for f/u pneumonia and thyroid. Prescriptions: LEVOXYL 50 MCG TABS (LEVOTHYROXINE SODIUM) 1 tab by mouth daily  #30 x 1   Entered and Authorized by:   Seymour Bars DO   Signed by:   Seymour Bars DO on 09/24/2010   Method used:   Electronically to        [facility] [phone]* (retail)       [address]       [place], [place]  [address]       Ph: [phone] or [phone]       Fax: [phone]   RxID:   [id number] ZITHROMAX Z-PAK 250 MG TABS (AZITHROMYCIN) take as directed  #1 x 0   Entered and Authorized by:   Seymour Bars DO   Signed by:   Seymour Bars DO on 09/24/2010   Method used:   Electronically to        [facility] [phone]* (retail)       [address]       [place], [place]  [address]       Ph: [phone] or [phone]       Fax: [phone]   RxID:   [id number] HYDROCODONE-HOMATROPINE 5-1.5 MG TABS (HYDROCODONE-HOMATROPINE) 5 ml by mouth q 6 hrs as needed cough  #200 ml x 0   Entered and Authorized by:   Seymour Bars DO   Signed by:   Seymour Bars DO on 09/24/2010   Method used:   Printed then faxed to ...       [facility] [phone][facility] (retail)       [address] [place], [place]  [address]       Ph: [phone] or [phone]       Fax: [phone]   RxID:   [id number]    Medication Administration  Injection # 1:    Medication: Rocephin  250mg     Diagnosis: PNEUMONIA (ICD-486)    Route: IM    Site: LUOQ gluteus    Exp Date: 12/2012    Lot #: [id number]    Comments: 1 gram    Patient tolerated injection without complications    Given by: Payton Spark CMA (September 24, 2010 2:13 PM)  Orders Added: 1)  Est. Patient Level III [02409] 2)  Rocephin  250mg  [J0696] 3)  Admin of Therapeutic  Inj  intramuscular or subcutaneous [96372]     Medication  Administration  Injection # 1:    Medication: Rocephin  250mg     Diagnosis: PNEUMONIA (ICD-486)    Route: IM    Site: LUOQ gluteus    Exp Date: 12/2012    Lot #: [id number]    Comments: 1 gram    Patient tolerated injection without complications    Given by: Payton Spark CMA (September 24, 2010 2:13 PM)  Orders Added: 1)  Est. Patient Level III [81191] 2)  Rocephin  250mg  [J0696] 3)  Admin of Therapeutic Inj  intramuscular or subcutaneous [47829]

## 2010-10-02 ENCOUNTER — Telehealth (INDEPENDENT_AMBULATORY_CARE_PROVIDER_SITE_OTHER): Payer: Self-pay | Admitting: *Deleted

## 2010-10-09 NOTE — Progress Notes (Signed)
Summary: [facility]-Triage  Phone Note Call from Patient Call back at Home Phone [phone]   Caller: Patient Call For: Seymour Bars DO Reason for Call: Acute Illness Summary of Call: took antibiotics and now has yeast infection.  OTC meds did not work.  Would like diflucan [facility].  Advised pt Dr. Cathey Endow out of office this afternoon and it will be tomorrow before I have an answer.  Pt agreeable. Initial call taken by: Francee Piccolo CMA Duncan Dull),  October 02, 2010 4:28 PM  Follow-up for Phone Call        Rx Called In Follow-up by: Nani Gasser MD,  October 03, 2010 9:06 AM    New/Updated Medications: FLUCONAZOLE 150 MG TABS (FLUCONAZOLE) Take 1 tablet by mouth once a day x 1 Prescriptions: FLUCONAZOLE 150 MG TABS (FLUCONAZOLE) Take 1 tablet by mouth once a day x 1  #1 x 0   Entered by:   Nani Gasser MD   Authorized by:   Seymour Bars DO   Signed by:   Nani Gasser MD on 10/03/2010   Method used:   Electronically to        [facility] [address] [phone]* (retail)       [address]       [place], [place]  [address]       Ph: [phone] or [phone]       Fax: [phone]   RxID:   [id number]

## 2010-11-10 ENCOUNTER — Encounter: Payer: Self-pay | Admitting: Family Medicine

## 2010-11-20 NOTE — Miscellaneous (Signed)
Summary: colonoscopy normal  Clinical Lists Changes  Observations: Added new observation of COLONRECACT: Repeat colonoscopy in 5 years.  (11/07/2010 12:42) Added new observation of COLONOSCOPY: Location:  [facility].   Results: Normal.  (11/07/2010 12:42)      Colonoscopy  Procedure date:  11/07/2010  Findings:      Location:  [facility].   Results: Normal.   Comments:      Repeat colonoscopy in 5 years.    Colonoscopy  Procedure date:  11/07/2010  Findings:      Location:  [facility].   Results: Normal.   Comments:      Repeat colonoscopy in 5 years.

## 2010-12-05 ENCOUNTER — Other Ambulatory Visit: Payer: Self-pay | Admitting: Family Medicine

## 2011-03-16 ENCOUNTER — Encounter: Payer: Self-pay | Admitting: Family Medicine

## 2011-03-16 ENCOUNTER — Telehealth: Payer: Self-pay | Admitting: Family Medicine

## 2011-03-16 ENCOUNTER — Ambulatory Visit (INDEPENDENT_AMBULATORY_CARE_PROVIDER_SITE_OTHER): Payer: BC Managed Care – PPO | Admitting: Family Medicine

## 2011-03-16 DIAGNOSIS — R238 Other skin changes: Secondary | ICD-10-CM

## 2011-03-16 DIAGNOSIS — E039 Hypothyroidism, unspecified: Secondary | ICD-10-CM

## 2011-03-16 DIAGNOSIS — M79605 Pain in left leg: Secondary | ICD-10-CM

## 2011-03-16 DIAGNOSIS — I809 Phlebitis and thrombophlebitis of unspecified site: Secondary | ICD-10-CM

## 2011-03-16 DIAGNOSIS — R233 Spontaneous ecchymoses: Secondary | ICD-10-CM

## 2011-03-16 LAB — CBC WITH DIFFERENTIAL/PLATELET
Basophils Absolute: 0.1 10*3/uL (ref 0.0–0.1)
Basophils Relative: 1 % (ref 0–1)
Eosinophils Absolute: 0.4 10*3/uL (ref 0.0–0.7)
Eosinophils Relative: 5 % (ref 0–5)
Hemoglobin: 13.2 g/dL (ref 12.0–15.0)
Lymphs Abs: 2.3 10*3/uL (ref 0.7–4.0)
Monocytes Absolute: 0.4 10*3/uL (ref 0.1–1.0)
Neutro Abs: 4.1 10*3/uL (ref 1.7–7.7)
WBC: 7.3 10*3/uL (ref 4.0–10.5)

## 2011-03-16 LAB — IRON: Iron: 150 ug/dL — ABNORMAL HIGH (ref 42–145)

## 2011-03-16 NOTE — Assessment & Plan Note (Signed)
Recheck TSH with labs today, on meds.

## 2011-03-16 NOTE — Progress Notes (Signed)
  Subjective:    Patient ID: Diane Villegas, female    DOB: 08-Sep-1970, 40 y.o.   MRN: [id number]  HPI  40 yo WF presents for redness of the L anterior tibia that she woke up with today.  5 days ago, she had a big bruise on the medial side of her knee but she denies any trauma.  Denies fevers or chills.   Felt tired and achey 5 days ago but o/w normal.  Reports a hx of 'easy bruising'.  Not on ASA or NSAIDs.  Denies swelling.  BP 118/72  Pulse 80  Temp(Src) 98.5 F (36.9 C) (Oral)  Ht 5\' 10"  (1.778 m)  Wt 135 lb (61.236 kg)  BMI 19.37 kg/m2  SpO2 97% Patient Active Problem List  Diagnoses Date Noted  . PNEUMONIA 09/24/2010  . RLQ PAIN 04/21/2010  . LUMBAR RADICULOPATHY, RIGHT 04/08/2010  . SCIATICA 03/21/2010  . CIGARETTE SMOKER 06/27/2009  . BACK PAIN, THORACIC REGION 04/01/2009  . FRACTURE, FINGER 04/01/2009  . HYPOTHYROIDISM 11/19/2008  . PREMATURE VENTRICULAR CONTRACTIONS 11/19/2008  . CHEST PAIN 11/19/2008  . ACUTE BRONCHITIS 10/26/2008  . ABDOMINAL PAIN 09/06/2008  . DEHYDRATION 06/25/2008  . OBSESSIVE-COMPULSIVE DISORDERS 06/19/2008  . MIGRAINE HEADACHE 06/19/2008  . URI 06/19/2008  . COUGH 06/19/2008      Review of Systems     Objective:   Physical Exam  Constitutional: She appears well-developed and well-nourished.  HENT:  Mouth/Throat: Oropharynx is clear and moist.  Neck: Neck supple.  Cardiovascular: Normal rate, regular rhythm and normal heart sounds.   No murmur heard. Pulmonary/Chest: Effort normal and breath sounds normal. No respiratory distress.  Musculoskeletal: She exhibits no edema.  Lymphadenopathy:    She has no cervical adenopathy.  Skin: Skin is warm and dry.       Bruising over medial / distal L knee/ medial leg following vein with anterior tibial redness/ tenderness.  No soft tissue edema or palpable cords.            Assessment & Plan:  Likely LLE superficial thrombophlebitis:  Will r/o cellulitis and DVT with labs today.  This  does appear to be a superficial thrombophlebitis, for which treatment is warm compresses, ASA and leg elevation.  H/o given to pt today.  F/u labs to also look for cause of easy bruising by tomorrow.

## 2011-03-16 NOTE — Telephone Encounter (Signed)
Closed

## 2011-03-16 NOTE — Patient Instructions (Signed)
Labs today.  Will call you w/ results by tomorrow.  Use warm compresses, elevate legs and take Aspirin for pain.    Phlebitis Phlebitis is a redness, tenderness and soreness (inflammation) in a vein. This can occur in your arms, legs, or torso (trunk), as well as deeper inside your body.  CAUSES Phlebitis can be triggered by multiple factors. These include:  Reduced (restricted) blood flow through your veins. This happens with prolonged bed rest, long distance travel, injury or surgery. Being overweight (obese) and pregnant can also restrict blood flow and lead to phlebitis.   Putting a catheter in the vein (intravenous or IV) and giving certain medications through in the vein (intravenously).   Cancer and cancer treatment.   Use of illegal intravenous drugs.   Inflammatory diseases.   Inherited (genetic) diseases that increase the risk for blood clots.   Hormone therapy (such as birth control pills).  SYMPTOMS  Red, tender, swollen, painful area on your skin.   Usually, the area will be long and narrow.   Low grade fever.   Significant firmness along the center of this area. This can indicate that a blood clot has formed.   Surrounding redness or a high fever, which can indicate an infection (cellulitis).  DIAGNOSIS  The appearance of your condition and your symptoms will cause your caregiver to suspect phlebitis. Usually, this is enough for a diagnosis.   Your caregiver may request blood tests or an ultrasound test of the area to be sure you do not have an infection or a blood clot. Blood tests and discussing your family history may also indicate if you have an underlying genetic disease that causes blood clots.   Occasionally, a piece of tissue is taken from the body (biopsy) if an unusual cause of phlebitis is suspected.  TREATMENT  Raise (elevate) the affected area above the level of the heart.   Apply a warm compress or heating pad for 20 minutes, 3 or 4 times a  day. If you use an electric heating pad, follow the directions so you do not burn yourself.   Anti-inflammatory medications are usually recommended. Follow your caregiver's directions.   Any IV catheter, if present, will be removed by your caregiver.   Your caregiver may prescribe medicines that kill germs (antibiotics) if an infection is present.   Your caregiver may recommend blood thinners if a blood clot is suspected or present.   Support stockings or bandages may be helpful, depending on the cause and location of the phlebitis.   Surgery may be needed to remove very damaged sections of vein, but this is rare.  HOME CARE INSTRUCTIONS  Take medications exactly as prescribed.   Follow up with your caregiver as directed.   Use support stockings or bandages if advised. These will speed healing and prevent recurrence.   If you are on blood thinners:   Do follow-up blood tests exactly as directed.   Check with your caregiver before using any new medications.   Wear a pendant to show that you are on blood thinners.   For phlebitis in the legs:   Avoid prolonged standing or bed rest.   Keep your legs moving. Raise your legs with sitting or lying.   Do not smoke.   Women, particularly those over the age of 79, should consider the risks and benefits of taking the contraceptive pill. This kind of hormone treatment can increase your risk for blood clots.  SEEK MEDICAL CARE IF:  You have  unusual bruising or any bleeding problems.   Swelling or pain in your affected arm or leg is not gradually improving.   You are on anti-inflammatory medication and you develop belly (abdominal) pain.  SEEK IMMEDIATE MEDICAL CARE IF:  An unexplained oral temperature above 100.5 F (38.1 C) develops.   You have sudden onset of chest pain or difficulty breathing.  Document Released: 08/04/2001 Document Re-Released: 06/07/2009 [facility] Patient Information 2011 [place], [place].

## 2011-03-17 ENCOUNTER — Telehealth: Payer: Self-pay | Admitting: Family Medicine

## 2011-03-17 LAB — D-DIMER, QUANTITATIVE: D-Dimer, Quant: 0.23 ug/mL-FEU (ref 0.00–0.48)

## 2011-03-17 LAB — PROTIME-INR: Prothrombin Time: 12.1 seconds (ref 11.6–15.2)

## 2011-03-17 NOTE — Telephone Encounter (Signed)
Pls let pt know that her D dimer came back normal indicating there is not sign of a deep vein blood clot.  Her blood counts, thyroid function and clotting tests all came back normal.  Stop OTC iron-- level is a little too high.

## 2011-03-18 ENCOUNTER — Telehealth: Payer: Self-pay | Admitting: Family Medicine

## 2011-03-18 NOTE — Telephone Encounter (Signed)
Pt called and needs work note for last two days.  Phlebitis of lL leg.   Plan:  Note completed and pt contacted to pup. Jarvis Newcomer, LPN Domingo Dimes

## 2011-03-18 NOTE — Telephone Encounter (Signed)
Pt aware of the above  

## 2011-03-24 ENCOUNTER — Other Ambulatory Visit: Payer: Self-pay | Admitting: Family Medicine

## 2011-04-13 ENCOUNTER — Encounter: Payer: Self-pay | Admitting: Cardiology

## 2011-05-14 NOTE — Telephone Encounter (Signed)
Completed.

## 2011-05-15 ENCOUNTER — Ambulatory Visit (INDEPENDENT_AMBULATORY_CARE_PROVIDER_SITE_OTHER): Payer: BC Managed Care – PPO | Admitting: Family Medicine

## 2011-05-15 ENCOUNTER — Encounter: Payer: Self-pay | Admitting: Family Medicine

## 2011-05-15 VITALS — BP 127/83 | HR 95 | Ht 70.0 in | Wt 139.0 lb

## 2011-05-15 DIAGNOSIS — H109 Unspecified conjunctivitis: Secondary | ICD-10-CM

## 2011-05-15 MED ORDER — SULFACETAMIDE SODIUM 10 % OP SOLN: 2.0000 [drp] | Freq: Four times a day (QID) | OPHTHALMIC | Status: AC
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
## 2011-05-20 ENCOUNTER — Encounter (HOSPITAL_COMMUNITY): Payer: BC Managed Care – PPO | Admitting: Psychiatry

## 2011-06-11 ENCOUNTER — Other Ambulatory Visit: Payer: Self-pay | Admitting: *Deleted

## 2011-06-11 MED ORDER — TOPIRAMATE 100 MG PO TABS: ORAL_TABLET | ORAL | Status: DC

## 2011-06-25 ENCOUNTER — Encounter: Payer: Self-pay | Admitting: Family Medicine

## 2011-06-29 ENCOUNTER — Telehealth: Payer: Self-pay | Admitting: *Deleted

## 2011-06-29 ENCOUNTER — Other Ambulatory Visit: Payer: Self-pay | Admitting: Family Medicine

## 2011-06-29 MED ORDER — PROMETHAZINE HCL 25 MG PO TABS: 25.0000 mg | ORAL_TABLET | Freq: Four times a day (QID) | ORAL | Status: AC | PRN

## 2011-06-29 MED ORDER — PROMETHAZINE HCL 25 MG PO TABS: 25.0000 mg | ORAL_TABLET | Freq: Four times a day (QID) | ORAL | Status: DC | PRN

## 2011-06-29 NOTE — Progress Notes (Signed)
Incorrectly sent to wrong pharm. Resent to [employer].

## 2011-06-29 NOTE — Telephone Encounter (Signed)
rx sent for phenergan. Keep ENT appt.
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
## 2011-07-03 ENCOUNTER — Ambulatory Visit (INDEPENDENT_AMBULATORY_CARE_PROVIDER_SITE_OTHER): Payer: BC Managed Care – PPO | Admitting: Family Medicine

## 2011-07-03 ENCOUNTER — Encounter: Payer: Self-pay | Admitting: Family Medicine

## 2011-07-03 VITALS — BP 123/79 | HR 88 | Ht 70.0 in | Wt 141.0 lb

## 2011-07-03 DIAGNOSIS — S022XXA Fracture of nasal bones, initial encounter for closed fracture: Secondary | ICD-10-CM

## 2011-07-03 DIAGNOSIS — IMO0002 Reserved for concepts with insufficient information to code with codable children: Secondary | ICD-10-CM

## 2011-07-03 DIAGNOSIS — T7491XA Unspecified adult maltreatment, confirmed, initial encounter: Secondary | ICD-10-CM

## 2011-07-03 DIAGNOSIS — S0512XA Contusion of eyeball and orbital tissues, left eye, initial encounter: Secondary | ICD-10-CM

## 2011-07-03 DIAGNOSIS — S0510XA Contusion of eyeball and orbital tissues, unspecified eye, initial encounter: Secondary | ICD-10-CM
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
## 2011-07-06 ENCOUNTER — Telehealth: Payer: Self-pay | Admitting: *Deleted

## 2011-07-06 MED ORDER — HYDROCODONE-ACETAMINOPHEN 5-325 MG PO TABS: ORAL_TABLET | ORAL | Status: DC

## 2011-07-06 NOTE — Telephone Encounter (Signed)
Patient called and states she wants pain meds called in that are not as strong as percocet that she was taking.please advise

## 2011-07-06 NOTE — Telephone Encounter (Signed)
OK will send rx for hydrocodone. Can half the tab if needed.

## 2011-07-07 ENCOUNTER — Encounter: Payer: Self-pay | Admitting: Family Medicine

## 2011-08-05 ENCOUNTER — Other Ambulatory Visit: Payer: Self-pay | Admitting: *Deleted

## 2011-08-05 NOTE — Telephone Encounter (Signed)
Pt notiifed. KJ LPN

## 2011-08-05 NOTE — Telephone Encounter (Signed)
Needs to get from Dr. Christell Constant.One of her partners should be able to fill it if she is out of town.

## 2011-08-05 NOTE — Telephone Encounter (Signed)
Pt calls and states sees Dr. Christell Constant and needs refill on the Luvox and xanax and she can not get into see her and wanted to know if you could refill these for her or even manage these for her

## 2011-08-11 ENCOUNTER — Telehealth: Payer: Self-pay | Admitting: *Deleted

## 2011-08-11 MED ORDER — FLUVOXAMINE MALEATE 100 MG PO TABS: 100.0000 mg | ORAL_TABLET | Freq: Two times a day (BID) | ORAL | Status: DC

## 2011-08-11 MED ORDER — ALPRAZOLAM ER 1 MG PO TB24: 1.0000 mg | ORAL_TABLET | Freq: Two times a day (BID) | ORAL | Status: DC

## 2011-08-11 NOTE — Telephone Encounter (Signed)
Pt calls and wants to know if you would refill her Luvox and Xanax that she takes for her OCD. Her old psych. Dropped her a a patient and the new psych can not see for 1 more week. She has been out of meds for 1 week now and having sx's. Please advise

## 2011-08-11 NOTE — Telephone Encounter (Signed)
OK to refill for one month 

## 2011-09-23 ENCOUNTER — Other Ambulatory Visit: Payer: Self-pay | Admitting: Family Medicine

## 2011-10-06 ENCOUNTER — Encounter: Payer: Self-pay | Admitting: Physician Assistant

## 2011-10-06 ENCOUNTER — Ambulatory Visit (INDEPENDENT_AMBULATORY_CARE_PROVIDER_SITE_OTHER): Payer: BC Managed Care – PPO | Admitting: Physician Assistant

## 2011-10-06 VITALS — BP 115/67 | HR 86 | Temp 98.2°F | Wt 147.0 lb

## 2011-10-06 DIAGNOSIS — K529 Noninfective gastroenteritis and colitis, unspecified: Secondary | ICD-10-CM

## 2011-10-06 DIAGNOSIS — K5289 Other specified noninfective gastroenteritis and colitis: Secondary | ICD-10-CM

## 2011-10-06 NOTE — Progress Notes (Signed)
  Subjective:    Patient ID: Diane Villegas, female    DOB: 1971/07/25, 41 y.o.   MRN: [id number]  HPI Patient presents to the clinic 2 days of nausea and diarrhea. Yesterday patient reports she had 15-20 loose stools. She denies any blood or mucus in stools. Today she has had 5-6 loose stools. Her symptoms have improved; however, she still had an abdominal cramps periodically. She still has some nausea but has not taken anything for it. She does not have an appetite. She denies any travel, antibiotics in the last 3 months, or exposure to any food/water that might be contaminated. She denies any fever, shortness of breath, wheezing or vomiting. She has had some chills last night. She is able to keep down fluids and she is streaking a lot of water.     Review of Systems     Objective:   Physical Exam  Constitutional: She is oriented to person, place, and time. She appears well-developed and well-nourished.  HENT:  Head: Normocephalic and atraumatic.  Cardiovascular: Normal rate, regular rhythm and normal heart sounds.   Pulmonary/Chest: Effort normal and breath sounds normal.  Abdominal: Soft. She exhibits no distension and no mass. There is no tenderness. There is no rebound and no guarding.       Hypoactive bowel sounds.  Neurological: She is alert and oriented to person, place, and time.  Skin: Skin is warm and dry.  Psychiatric: She has a normal mood and affect. Her behavior is normal.          Assessment & Plan:  Gastroenteritis-patient was educated on the importance of hydration. Patient can consider Imodium to help control the diarrhea. Patient was offered medication for nausea but she denied. She was given a handout on gastroenteritis and symptomatic care home. She was told to followup if symptoms worsen or do not resolve.

## 2011-10-06 NOTE — Patient Instructions (Addendum)
Stay hydrated. Call if not improving or worsening in the next 48 hours. Can consider Immodium for diarrhea.  Viral Gastroenteritis Viral gastroenteritis is also known as stomach flu. This condition affects the stomach and intestinal tract. The illness typically lasts 3 to 8 days. Most people develop an immune response. This eventually gets rid of the virus. While this natural response develops, the virus can make you quite ill.  CAUSES  Diarrhea and vomiting are often caused by a virus. Medicines (antibiotics) that kill germs will not help unless there is also a germ (bacterial) infection. SYMPTOMS  The most common symptom is diarrhea. This can cause severe loss of fluids (dehydration) and body salt (electrolyte) imbalance. TREATMENT  Treatments for this illness are aimed at rehydration. Antidiarrheal medicines are not recommended. They do not decrease diarrhea volume and may be harmful. Usually, home treatment is all that is needed. The most serious cases involve vomiting so severely that you are not able to keep down fluids taken by mouth (orally). In these cases, intravenous (IV) fluids are needed. Vomiting with viral gastroenteritis is common, but it will usually go away with treatment. HOME CARE INSTRUCTIONS  Small amounts of fluids should be taken frequently. Large amounts at one time may not be tolerated. Plain water may be harmful in infants and the elderly. Oral rehydration solutions (ORS) are available at pharmacies and grocery stores. ORS replace water and important electrolytes in proper proportions. Sports drinks are not as effective as ORS and may be harmful due to sugars worsening diarrhea.  As a general guideline for children, replace any new fluid losses from diarrhea or vomiting with ORS as follows:   If your child weighs 22 pounds or under (10 kg or less), give 60-120 mL (1/4 - 1/2 cup or 2 - 4 ounces) of ORS for each diarrheal stool or vomiting episode.   If your child weighs  more than 22 pounds (more than 10 kgs), give 120-240 mL (1/2 - 1 cup or 4 - 8 ounces) of ORS for each diarrheal stool or vomiting episode.   In a child with vomiting, it may be helpful to give the above ORS replacement in 5 mL (1 teaspoon) amounts every 5 minutes, then increase as tolerated.   While correcting for dehydration, children should eat normally. However, foods high in sugar should be avoided because this may worsen diarrhea. Large amounts of carbonated soft drinks, juice, gelatin desserts, and other highly sugared drinks should be avoided.   After correction of dehydration, other liquids that are appealing to the child may be added. Children should drink small amounts of fluids frequently and fluids should be increased as tolerated.   Adults should eat normally while drinking more fluids than usual. Drink small amounts of fluids frequently and increase as tolerated. Drink enough water and fluids to keep your urine clear or pale yellow. Broths, weak decaffeinated tea, lemon-lime soft drinks (allowed to go flat), and ORS replace fluids and electrolytes.   Avoid:   Carbonated drinks.   Juice.   Extremely hot or cold fluids.   Caffeine drinks.   Fatty, greasy foods.   Alcohol.   Tobacco.   Too much intake of anything at one time.   Gelatin desserts.   Probiotics are active cultures of beneficial bacteria. They may lessen the amount and number of diarrheal stools in adults. Probiotics can be found in yogurt with active cultures and in supplements.   Wash your hands well to avoid spreading bacteria and viruses.  Antidiarrheal medicines are not recommended for infants and children.   Only take over-the-counter or prescription medicines for pain, discomfort, or fever as directed by your caregiver. Do not give aspirin to children.   For adults with dehydration, ask your caregiver if you should continue all prescribed and over-the-counter medicines.   If your caregiver has  given you a follow-up appointment, it is very important to keep that appointment. Not keeping the appointment could result in a lasting (chronic) or permanent injury and disability. If there is any problem keeping the appointment, you must call to reschedule.  SEEK IMMEDIATE MEDICAL CARE IF:   You are unable to keep fluids down.   There is no urine output in 6 to 8 hours or there is only a small amount of very dark urine.   You develop shortness of breath.   There is blood in the vomit (may look like coffee grounds) or stool.   Belly (abdominal) pain develops, increases, or localizes.   There is persistent vomiting or diarrhea.   You have a fever.   Your baby is older than 3 months with a rectal temperature of 102 F (38.9 C) or higher.   Your baby is 34 months old or younger with a rectal temperature of 100.4 F (38 C) or higher.  MAKE SURE YOU:   Understand these instructions.   Will watch your condition.   Will get help right away if you are not doing well or get worse.  Document Released: 08/10/2005 Document Revised: 04/22/2011 Document Reviewed: 12/22/2006 [facility] Patient Information 2012 [place], [place].

## 2011-12-16 ENCOUNTER — Ambulatory Visit (INDEPENDENT_AMBULATORY_CARE_PROVIDER_SITE_OTHER): Payer: Self-pay | Admitting: Physician Assistant

## 2011-12-16 ENCOUNTER — Encounter: Payer: Self-pay | Admitting: Physician Assistant

## 2011-12-16 VITALS — BP 105/68 | HR 73 | Ht 70.0 in | Wt 154.0 lb

## 2011-12-16 DIAGNOSIS — F3289 Other specified depressive episodes: Secondary | ICD-10-CM

## 2011-12-16 DIAGNOSIS — F39 Unspecified mood [affective] disorder: Secondary | ICD-10-CM

## 2011-12-16 DIAGNOSIS — F329 Major depressive disorder, single episode, unspecified: Secondary | ICD-10-CM

## 2011-12-16 DIAGNOSIS — F32A Depression, unspecified: Secondary | ICD-10-CM

## 2011-12-16 MED ORDER — LAMOTRIGINE 25 MG PO TABS: 25.0000 mg | ORAL_TABLET | Freq: Every day | ORAL | Status: DC

## 2011-12-16 MED ORDER — LAMOTRIGINE 100 MG PO TABS: 100.0000 mg | ORAL_TABLET | Freq: Every day | ORAL | Status: DC

## 2011-12-16 NOTE — Patient Instructions (Addendum)
Start exercising 30 of cardio a day at least 4 times a week. Dose back up on lamictal. Stay on all other medications. Will refer to psychiatry. Follow up here in 4-6 weeks. Gave note for work.     (20 min Jillian Michaels shred)

## 2011-12-16 NOTE — Progress Notes (Signed)
  Subjective:    Patient ID: Diane Villegas, female    DOB: 10/30/1970, 41 y.o.   MRN: [id number]  HPI Patient come into office because she is feeling very down and can't seem to get out of bed for 2 weeks. She helps take care of her parents and they have both been in and out of the hospital lately. She thought Lamictal was making her gain weight so she stopped it 2 weeks ago. She was seeing a psychiatrist but she stopped seeing them in february because she didn't feel like they really cared. She would like a new psychiatrist. 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      Review of Systems     Objective:   Physical Exam  Constitutional: She is oriented to person, place, and time. She appears well-developed and well-nourished.  HENT:  Head: Normocephalic and atraumatic.  Cardiovascular: Normal rate, regular rhythm and normal heart sounds.   Pulmonary/Chest: Effort normal and breath sounds normal. She has no wheezes.  Neurological: She is alert and oriented to person, place, and time.  Skin: Skin is warm and dry.  Psychiatric:       Flat affect. Very down. Did not want to make eye contact.          Assessment & Plan:  Mood disorder/Depression- GAD-7 was 16 and PHQ-9 was 20. Restarted Lamictal and will tirate up. Long discussion was had about not stopping anti-depressant abruptly. Due to her not having a psychiatrist currently I will prescribe but I will refer and let them handle medications once we can get you an appointment. Discussed that her gaining weight vs losing her job or having a mental breakdown that it was worth gaining some weight. She was informed that a lot of other medication interfer with Luvox and lamictal does not;therefore, a great drug to be on. Will refer for new psychiatrist. Wrote out for work today. Did talk about exercise and how exercise along can really make a difference in her mood. Recheck in 6 weeks.

## 2012-01-01 ENCOUNTER — Other Ambulatory Visit: Payer: Self-pay | Admitting: *Deleted

## 2012-01-01 MED ORDER — FLUVOXAMINE MALEATE 100 MG PO TABS: 100.0000 mg | ORAL_TABLET | Freq: Two times a day (BID) | ORAL | Status: DC

## 2012-01-11 ENCOUNTER — Encounter: Payer: Self-pay | Admitting: Family Medicine

## 2012-01-11 ENCOUNTER — Ambulatory Visit (INDEPENDENT_AMBULATORY_CARE_PROVIDER_SITE_OTHER): Payer: Self-pay | Admitting: Family Medicine

## 2012-01-11 VITALS — BP 115/72 | HR 72 | Ht 70.0 in | Wt 158.0 lb

## 2012-01-11 DIAGNOSIS — IMO0002 Reserved for concepts with insufficient information to code with codable children: Secondary | ICD-10-CM

## 2012-01-11 DIAGNOSIS — G43909 Migraine, unspecified, not intractable, without status migrainosus: Secondary | ICD-10-CM

## 2012-01-11 DIAGNOSIS — M549 Dorsalgia, unspecified: Secondary | ICD-10-CM

## 2012-01-11 DIAGNOSIS — R5383 Other fatigue: Secondary | ICD-10-CM

## 2012-01-11 DIAGNOSIS — R5381 Other malaise: Secondary | ICD-10-CM

## 2012-01-11 MED ORDER — CYCLOBENZAPRINE HCL 10 MG PO TABS: 10.0000 mg | ORAL_TABLET | Freq: Every evening | ORAL | Status: AC | PRN

## 2012-01-11 NOTE — Patient Instructions (Addendum)
Please stop at the front to schedule your appointment for psychiatry for medication management Try the frova for migarines.  Can repeat dose in 2 hours if needed. No more than 2 tabs in a 24 hour periods.    Back Pain, Adult Low back pain is very common. About 1 in 5 people have back pain. The cause of low back pain is rarely dangerous. The pain often gets better over time. About half of people with a sudden onset of back pain feel better in just 2 weeks. About 8 in 10 people feel better by 6 weeks.   CAUSES Some common causes of back pain include:  Strain of the muscles or ligaments supporting the spine.   Wear and tear (degeneration) of the spinal discs.   Arthritis.   Direct injury to the back.  DIAGNOSIS Most of the time, the direct cause of low back pain is not known. However, back pain can be treated effectively even when the exact cause of the pain is unknown. Answering your caregiver's questions about your overall health and symptoms is one of the most accurate ways to make sure the cause of your pain is not dangerous. If your caregiver needs more information, he or she may order lab work or imaging tests (X-rays or MRIs). However, even if imaging tests show changes in your back, this usually does not require surgery. HOME CARE INSTRUCTIONS For many people, back pain returns. Since low back pain is rarely dangerous, it is often a condition that people can learn to manage on their own.    Remain active. It is stressful on the back to sit or stand in one place. Do not sit, drive, or stand in one place for more than 30 minutes at a time. Take short walks on level surfaces as soon as pain allows. Try to increase the length of time you walk each day.   Do not stay in bed. Resting more than 1 or 2 days can delay your recovery.   Do not avoid exercise or work. Your body is made to move. It is not dangerous to be active, even though your back may hurt. Your back will likely heal faster if  you return to being active before your pain is gone.   Pay attention to your body when you  bend and lift. Many people have less discomfort when lifting if they bend their knees, keep the load close to their bodies, and avoid twisting. Often, the most comfortable positions are those that put less stress on your recovering back.   Find a comfortable position to sleep. Use a firm mattress and lie on your side with your knees slightly bent. If you lie on your back, put a pillow under your knees.   Only take over-the-counter or prescription medicines as directed by your caregiver. Over-the-counter medicines to reduce pain and inflammation are often the most helpful. Your caregiver may prescribe muscle relaxant drugs. These medicines help dull your pain so you can more quickly return to your normal activities and healthy exercise.   Put ice on the injured area.   Put ice in a plastic bag.   Place a towel between your skin and the bag.   Leave the ice on for 15 to 20 minutes, 3 to 4 times a day for the first 2 to 3 days. After that, ice and heat may be alternated to reduce pain and spasms.   Ask your caregiver about trying back exercises and gentle massage. This may  be of some benefit.   Avoid feeling anxious or stressed. Stress increases muscle tension and can worsen back pain. It is important to recognize when you are anxious or stressed and learn ways to manage it. Exercise is a great option.  SEEK MEDICAL CARE IF:  You have pain that is not relieved with rest or medicine.   You have pain that does not improve in 1 week.   You have new symptoms.   You are generally not feeling well.  SEEK IMMEDIATE MEDICAL CARE IF:    You have pain that radiates from your back into your legs.   You develop new bowel or bladder control problems.   You have unusual weakness or numbness in your arms or legs.   You develop nausea or vomiting.   You develop abdominal pain.   You feel faint.  Document  Released: 08/10/2005 Document Revised: 07/30/2011 Document Reviewed: 12/29/2010 [facility] Patient Information 2012 [place], [place].
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
## 2012-01-13 ENCOUNTER — Other Ambulatory Visit: Payer: Self-pay | Admitting: Family Medicine

## 2012-01-18 ENCOUNTER — Other Ambulatory Visit: Payer: Self-pay | Admitting: Physician Assistant

## 2012-03-21 ENCOUNTER — Encounter: Payer: Self-pay | Admitting: Physician Assistant

## 2012-03-21 ENCOUNTER — Ambulatory Visit (INDEPENDENT_AMBULATORY_CARE_PROVIDER_SITE_OTHER): Payer: Self-pay | Admitting: Physician Assistant

## 2012-03-21 VITALS — BP 103/68 | HR 88 | Ht 70.0 in | Wt 155.0 lb

## 2012-03-21 DIAGNOSIS — Z862 Personal history of diseases of the blood and blood-forming organs and certain disorders involving the immune mechanism: Secondary | ICD-10-CM

## 2012-03-21 DIAGNOSIS — R5383 Other fatigue: Secondary | ICD-10-CM

## 2012-03-21 DIAGNOSIS — F329 Major depressive disorder, single episode, unspecified: Secondary | ICD-10-CM

## 2012-03-21 DIAGNOSIS — F39 Unspecified mood [affective] disorder: Secondary | ICD-10-CM

## 2012-03-21 DIAGNOSIS — F3289 Other specified depressive episodes: Secondary | ICD-10-CM

## 2012-03-21 DIAGNOSIS — R5381 Other malaise: Secondary | ICD-10-CM

## 2012-03-21 DIAGNOSIS — K5900 Constipation, unspecified: Secondary | ICD-10-CM

## 2012-03-21 DIAGNOSIS — F32A Depression, unspecified: Secondary | ICD-10-CM

## 2012-03-21 NOTE — Patient Instructions (Addendum)
Get labs and will call with results. Miralax 2 times a day. Drinking lots of water. Increase fiber.

## 2012-03-22 LAB — CBC WITH DIFFERENTIAL/PLATELET
Basophils Absolute: 0 10*3/uL (ref 0.0–0.1)
Basophils Relative: 1 % (ref 0–1)
HCT: 39.6 % (ref 36.0–46.0)
Hemoglobin: 12.9 g/dL (ref 12.0–15.0)
Lymphocytes Relative: 35 % (ref 12–46)
MCHC: 32.6 g/dL (ref 30.0–36.0)
Monocytes Absolute: 0.4 10*3/uL (ref 0.1–1.0)
Neutro Abs: 3.4 10*3/uL (ref 1.7–7.7)
Neutrophils Relative %: 57 % (ref 43–77)
RDW: 12.9 % (ref 11.5–15.5)
WBC: 6 10*3/uL (ref 4.0–10.5)

## 2012-03-22 LAB — VITAMIN B12: Vitamin B-12: 237 pg/mL (ref 211–911)

## 2012-03-22 LAB — FERRITIN: Ferritin: 148 ng/mL (ref 10–291)

## 2012-03-22 LAB — TSH: TSH: 5.308 u[IU]/mL — ABNORMAL HIGH (ref 0.350–4.500)

## 2012-03-22 MED ORDER — LEVOTHYROXINE SODIUM 50 MCG PO CAPS: 50.0000 ug | ORAL_CAPSULE | Freq: Every day | ORAL | Status: DC

## 2012-03-22 NOTE — Progress Notes (Signed)
  Subjective:    Patient ID: Diane Villegas, female    DOB: October 09, 1970, 41 y.o.   MRN: [id number]  HPI Pt comes into the clinic because she has been so tired that she has not been out of bed in 3 weeks. She has not been able to go to work or do anything. She has seen psychiatry for her depression and mood disorder and they left her on lamictal 100mg  a day as she has been on for a while. She did not show up for her follow up appt with pysch because she doesn't really connect with her.She also complains of leg weakness and pain bilaterally. She has no appetite. She has a hx of anemia and not taking iron currently. She also has a hx of hypothyroidism but is not currently on meds. Denies any suicidal thoughts.  She has also had some recent constipation. Trying to drink more and taking laxitives it is helping some. Has not started any new medications or made any diet changes. Denies any blood in stool.   Review of Systems     Objective:   Physical Exam  Constitutional: She is oriented to person, place, and time. She appears well-developed and well-nourished.  HENT:  Head: Normocephalic and atraumatic.  Neck: Normal range of motion. Neck supple. No thyromegaly present.  Cardiovascular: Normal rate, regular rhythm and normal heart sounds.   Pulmonary/Chest: Effort normal and breath sounds normal. She has no wheezes.  Neurological: She is alert and oriented to person, place, and time.  Skin:       Appears pale.  Psychiatric:       Flat affect.          Assessment & Plan:  Depression/mood disorder/fatigue/hypothyrodism/hx of IDA- I feel like this fatigue is more depression and mood disorder than metabolic; however, I feel like it might be a combination. I will order TSH, CBC, B12 to see if treating any of these conditions will help fatigue. I encouraged patient to follow up with psychiatrist I think that meds need to be adjusted. Pt needs paperwork filled out for FMLA. I told her that her  psychiatrist would be the best person to fill out that paperwork. Will call with lab results and treat accordingly.  Constipation- Increase fiber, stay hydrated, Miralax twice a day, colace over the counter for stool softner Call if not improving.Marland Kitchen

## 2012-04-07 ENCOUNTER — Encounter: Payer: Self-pay | Admitting: Sports Medicine

## 2012-04-07 ENCOUNTER — Ambulatory Visit (INDEPENDENT_AMBULATORY_CARE_PROVIDER_SITE_OTHER): Payer: Self-pay | Admitting: Sports Medicine

## 2012-04-07 VITALS — BP 106/63 | HR 82 | Temp 97.9°F | Resp 16 | Wt 153.0 lb

## 2012-04-07 DIAGNOSIS — E039 Hypothyroidism, unspecified: Secondary | ICD-10-CM

## 2012-04-07 DIAGNOSIS — B86 Scabies: Secondary | ICD-10-CM | POA: Insufficient documentation

## 2012-04-07 MED ORDER — PERMETHRIN 5 % EX CREA: TOPICAL_CREAM | Freq: Once | CUTANEOUS | Status: AC

## 2012-04-07 NOTE — Assessment & Plan Note (Signed)
Her symptoms, as well as examination highly suggestive. I have called in per my cream for her. She understands she may reapply x1 in 2 weeks if needed. She will come back to see me as needed for this.

## 2012-04-07 NOTE — Patient Instructions (Addendum)
Scabies Scabies are small bugs (mites) that burrow under the skin and cause red bumps and severe itching. These bugs can only be seen with a microscope. Scabies are highly contagious. They can spread easily from person to person by direct contact. They are also spread through sharing clothing or linens that have the scabies mites living in them. It is not unusual for an entire family to become infected through shared towels, clothing, or bedding.  HOME CARE INSTRUCTIONS   Your caregiver may prescribe a cream or lotion to kill the mites. If this cream is prescribed; massage the cream into the entire area of the body from the neck to the bottom of both feet. Also massage the cream into the scalp and face if your child is less than 1 year old. Avoid the eyes and mouth.   Leave the cream on for 8 to12 hours. Do not wash your hands after application. Your child should bathe or shower after the 8 to 12 hour application period. Sometimes it is helpful to apply the cream to your child at right before bedtime.   One treatment is usually effective and will eliminate approximately 95% of infestations. For severe cases, your caregiver may decide to repeat the treatment in 1 week. Everyone in your household should be treated with one application of the cream.   New rashes or burrows should not appear after successful treatment within 24 to 48 hours; however the itching and rash may last for 2 to 4 weeks after successful treatment. If your symptoms persist longer than this, see your caregiver.   Your caregiver also may prescribe a medication to help with the itching or to help the rash go away more quickly.   Scabies can live on clothing or linens for up to 3 days. Your entire child's recently used clothing, towels, stuffed toys, and bed linens should be washed in hot water and then dried in a dryer for at least 20 minutes on high heat. Items that cannot be washed should be enclosed in a plastic bag for at least 3  days.   To help relieve itching, bathe your child in a cool bath or apply cool washcloths to the affected areas.   Your child may return to school after treatment with the prescribed cream.  SEEK MEDICAL CARE IF:   The itching persists longer than 4 weeks after treatment.   The rash spreads or becomes infected (the area has red blisters or yellow-tan crust).  Document Released: 08/10/2005 Document Revised: 07/30/2011 Document Reviewed: 12/19/2008 [employer] Patient Information 2012 [employer]. 
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
## 2012-04-07 NOTE — Assessment & Plan Note (Addendum)
Most recent TSH elevated. She's been on levothyroxine for 2 weeks. I think that this will significantly improve her symptoms. This is the likely cause of her low blood pressure, she doesn't or so she has not missed any doses She desires to follow up with her primary care physician regarding further management of her hypothyroidism.

## 2012-04-21 ENCOUNTER — Ambulatory Visit: Payer: Self-pay | Admitting: Sports Medicine

## 2013-01-31 ENCOUNTER — Telehealth: Payer: Self-pay | Admitting: *Deleted

## 2013-01-31 NOTE — Telephone Encounter (Signed)
lvm informing Amy that Dr. Linford Arnold has not seen this patient. Also faxed the cover sheet w/explanation informing that this pt has not been seen since 8.15.2013 and that we had no request or record of any recent hospital d/c asking for [facility].Loralee Pacas [place]

## 2013-02-13 ENCOUNTER — Encounter: Payer: Self-pay | Admitting: Family Medicine

## 2013-03-24 ENCOUNTER — Ambulatory Visit: Payer: Self-pay | Admitting: Family Medicine

## 2013-10-23 DIAGNOSIS — T847XXA Infection and inflammatory reaction due to other internal orthopedic prosthetic devices, implants and grafts, initial encounter: Secondary | ICD-10-CM | POA: Insufficient documentation

## 2013-11-02 DIAGNOSIS — IMO0002 Reserved for concepts with insufficient information to code with codable children: Secondary | ICD-10-CM | POA: Insufficient documentation

## 2013-11-02 DIAGNOSIS — L089 Local infection of the skin and subcutaneous tissue, unspecified: Secondary | ICD-10-CM

## 2014-03-14 ENCOUNTER — Encounter (HOSPITAL_COMMUNITY): Payer: Self-pay | Admitting: Physician Assistant

## 2014-03-14 ENCOUNTER — Ambulatory Visit (INDEPENDENT_AMBULATORY_CARE_PROVIDER_SITE_OTHER): Payer: MEDICAID | Admitting: Physician Assistant

## 2014-03-14 VITALS — BP 163/65 | HR 78 | Ht 70.0 in | Wt 182.0 lb

## 2014-03-14 DIAGNOSIS — F3130 Bipolar disorder, current episode depressed, mild or moderate severity, unspecified: Secondary | ICD-10-CM

## 2014-03-14 DIAGNOSIS — F429 Obsessive-compulsive disorder, unspecified: Secondary | ICD-10-CM

## 2014-03-14 DIAGNOSIS — F4310 Post-traumatic stress disorder, unspecified: Secondary | ICD-10-CM

## 2014-03-14 DIAGNOSIS — F331 Major depressive disorder, recurrent, moderate: Secondary | ICD-10-CM | POA: Insufficient documentation
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
## 2014-03-14 NOTE — Patient Instructions (Signed)
1. Get labs as written. 2. Schedule with OPT as planned. 3. Follow up 6-8 weeks for med refills.

## 2014-03-29 ENCOUNTER — Ambulatory Visit (HOSPITAL_COMMUNITY): Payer: Self-pay | Admitting: Professional Counselor

## 2014-04-03 DIAGNOSIS — L089 Local infection of the skin and subcutaneous tissue, unspecified: Secondary | ICD-10-CM | POA: Insufficient documentation

## 2014-05-02 ENCOUNTER — Ambulatory Visit (HOSPITAL_COMMUNITY): Payer: Self-pay | Admitting: Physician Assistant

## 2014-06-12 ENCOUNTER — Encounter (INDEPENDENT_AMBULATORY_CARE_PROVIDER_SITE_OTHER): Payer: Self-pay

## 2014-06-12 ENCOUNTER — Encounter (HOSPITAL_COMMUNITY): Payer: Self-pay | Admitting: Physician Assistant

## 2014-06-12 ENCOUNTER — Ambulatory Visit (INDEPENDENT_AMBULATORY_CARE_PROVIDER_SITE_OTHER): Payer: MEDICAID | Admitting: Physician Assistant

## 2014-06-12 VITALS — BP 118/69 | HR 74 | Wt 178.0 lb

## 2014-06-12 DIAGNOSIS — F42 Obsessive-compulsive disorder: Secondary | ICD-10-CM

## 2014-06-12 DIAGNOSIS — F331 Major depressive disorder, recurrent, moderate: Secondary | ICD-10-CM

## 2014-06-12 DIAGNOSIS — F329 Major depressive disorder, single episode, unspecified: Secondary | ICD-10-CM | POA: Insufficient documentation

## 2014-06-12 DIAGNOSIS — F429 Obsessive-compulsive disorder, unspecified: Secondary | ICD-10-CM

## 2014-06-12 DIAGNOSIS — M869 Osteomyelitis, unspecified: Secondary | ICD-10-CM

## 2014-06-12 MED ORDER — FLUVOXAMINE MALEATE 100 MG PO TABS: 100.0000 mg | ORAL_TABLET | Freq: Two times a day (BID) | ORAL | Status: DC

## 2014-06-12 MED ORDER — TOPIRAMATE 200 MG PO TABS: 200.0000 mg | ORAL_TABLET | Freq: Every day | ORAL | Status: DC

## 2014-06-12 NOTE — Patient Instructions (Signed)
1. Continue all medication as ordered. 2. Call this office if you have any questions or concerns. 3. Continue to get regular exercise 3-5 times a week. 4. Continue to eat a healthy nutritionally balanced diet. 5. Continue to reduce stress and anxiety through activities such as yoga, mindfulness, meditation and or prayer. 6. Keep all appointments with your out patient therapist and have notes forwarded to this office. (If you do not have one and would like to be scheduled with a therapist, please let our office assist you with this.) 7. Follow up as planned 2 months.

## 2014-06-12 NOTE — Progress Notes (Signed)
  [facility] [id number] Progress Note  Diane Villegas [id number] 43 y.o.  06/12/2014 9:47 AM  Chief Complaint: Medication management OCD and MDD  History of Present Illness: Suicidal Ideation: No Plan Formed: No Patient has means to carry out plan: No  Homicidal Ideation: No Plan Formed: No Patient has means to carry out plan: No  Review of Systems: Psychiatric: Agitation: Yes Hallucination: No Depressed Mood: Yes 5/10 Insomnia: Yes Hypersomnia: No Altered Concentration: Yes Feels Worthless: Yes Grandiose Ideas: No Belief In Special Powers: No New/Increased Substance Abuse: No Compulsions: No  Neurologic: Headache: No Seizure: No Paresthesias: No  Past Medical Family, Social History: No changes, not currently working  Outpatient Encounter Prescriptions as of 06/12/2014  Medication Sig  . eletriptan (RELPAX) 40 MG tablet Take 40 mg by mouth.  . fluvoxaMINE (LUVOX) 100 MG tablet Take 1 tablet (100 mg total) by mouth 2 (two) times daily.  . Levothyroxine Sodium 50 MCG CAPS Take 1 capsule (50 mcg total) by mouth daily. Take first thing in the AM 30 min before breakfast.  . METRONIDAZOLE, TOPICAL, 0.75 % LOTN Apply 1 application topically 2 (two) times daily.  . Multiple Vitamin (MULTIVITAMIN) tablet Take 1 tablet by mouth daily.    Marland Kitchen. topiramate (TOPAMAX) 200 MG tablet Take 200 mg by mouth.  . [DISCONTINUED] ondansetron (ZOFRAN-ODT) 4 MG disintegrating tablet Take 4 mg by mouth.  . cyclobenzaprine (FLEXERIL) 10 MG tablet Take 10 mg by mouth.  . [DISCONTINUED] zonisamide (ZONEGRAN) 100 MG capsule Take 300 mg by mouth daily.    Past Psychiatric History/Hospitalization(s): Anxiety: Yes  6/10 Bipolar Disorder: No Depression: Yes Mania: No Psychosis: Yes Schizophrenia: No Personality Disorder: No Hospitalization for psychiatric illness: Yes History of Electroconvulsive Shock Therapy: No Prior Suicide Attempts: Yes last attempt 1 year ago  Physical  Exam: Constitutional:  BP 118/69  Pulse 74  Wt 178 lb (80.74 kg)  General Appearance: alert, oriented, no acute distress  Musculoskeletal: Strength & Muscle Tone: decreased Gait & Station: ataxic Patient leans: N/A  Psychiatric: Speech (describe rate, volume, coherence, spontaneity, and abnormalities if any): normal  Thought Process (describe rate, content, abstract reasoning, and computation): normal  Associations: Coherent and Relevant  Thoughts: normal  Mental Status: Orientation: oriented to person, place and time/date Mood & Affect: depressed affect and anxiety Attention Span & Concentration: focused  Medical Decision Making (Choose Three): Established Problem, Stable/Improving (1) and New Problem, with no additional work-up planned (3)  Assessment: Axis I: OCD, MDD w/o medications x 1 week.  Axis II:   Axis III: New dx of Osteomyelitis  Axis IV:   Axis V:    Plan:  1. Continue all medication as ordered. 2. Call this office if you have any questions or concerns. 3. Continue to get regular exercise 3-5 times a week. 4. Continue to eat a healthy nutritionally balanced diet. 5. Continue to reduce stress and anxiety through activities such as yoga, mindfulness, meditation and or prayer. 6. Keep all appointments with your out patient therapist and have notes forwarded to this office. (If you do not have one and would like to be scheduled with a therapist, please let our office assist you with this.) 7. Follow up as planned 2 months.  Bliss Tsang, PA-C 06/12/2014

## 2014-06-19 ENCOUNTER — Ambulatory Visit (HOSPITAL_COMMUNITY): Payer: Self-pay | Admitting: Physician Assistant

## 2014-07-09 ENCOUNTER — Telehealth (HOSPITAL_COMMUNITY): Payer: Self-pay | Admitting: *Deleted

## 2014-07-09 DIAGNOSIS — F429 Obsessive-compulsive disorder, unspecified: Secondary | ICD-10-CM

## 2014-07-09 DIAGNOSIS — F331 Major depressive disorder, recurrent, moderate: Secondary | ICD-10-CM

## 2014-07-09 DIAGNOSIS — M869 Osteomyelitis, unspecified: Secondary | ICD-10-CM

## 2014-07-09 NOTE — Telephone Encounter (Addendum)
Pt request a refill for Luvox 100mg  B.I.D. Pt would like to speak with Lloyd Hugereil because she is unsure if she needs to increase dosage to 300mg  daily. Please advise pt prior to refill [phone].  I spoke with patient who notes that she normally takes 300mg  a day, but had been off of her medications when she saw me. As of now she doesn't have any unmanageable symptoms and is ok to wait until her office appointment on the 17th of December.  [facility] as noted x 1 and she will follow up as planned.Rona RavensNeil T. Mashburn RPAC 3:19 PM 07/11/2014

## 2014-07-11 MED ORDER — FLUVOXAMINE MALEATE 100 MG PO TABS: 100.0000 mg | ORAL_TABLET | Freq: Two times a day (BID) | ORAL | Status: DC

## 2014-08-09 ENCOUNTER — Ambulatory Visit (INDEPENDENT_AMBULATORY_CARE_PROVIDER_SITE_OTHER): Payer: MEDICAID | Admitting: Physician Assistant

## 2014-08-09 DIAGNOSIS — F429 Obsessive-compulsive disorder, unspecified: Secondary | ICD-10-CM

## 2014-08-09 DIAGNOSIS — F331 Major depressive disorder, recurrent, moderate: Secondary | ICD-10-CM

## 2014-08-09 DIAGNOSIS — F42 Obsessive-compulsive disorder: Secondary | ICD-10-CM

## 2014-08-09 DIAGNOSIS — M869 Osteomyelitis, unspecified: Secondary | ICD-10-CM

## 2014-08-09 DIAGNOSIS — F329 Major depressive disorder, single episode, unspecified: Secondary | ICD-10-CM

## 2014-08-09 MED ORDER — FLUVOXAMINE MALEATE 100 MG PO TABS: 100.0000 mg | ORAL_TABLET | Freq: Two times a day (BID) | ORAL | Status: DC

## 2014-08-09 MED ORDER — FLUVOXAMINE MALEATE 50 MG PO TABS: 50.0000 mg | ORAL_TABLET | Freq: Every day | ORAL | Status: DC

## 2014-08-09 NOTE — Progress Notes (Signed)
  [facility] [id number] Progress Note  Diane Villegas [id number] 43 y.o.  08/09/2014 2:38 PM  Chief Complaint: Medication management OCD and MDD  History of Present Illness: Patient notes that she is not at her original dose of Luvox, and has reported some increase in anxiety and repeating herself, "not terrible" and she currently rates her level of OCD as a 4/10.  Not the worst it's ever been, and her anxiety is the most worrisome.  Suicidal Ideation: No Plan Formed: No Patient has means to carry out plan: No  Homicidal Ideation: No Plan Formed: No Patient has means to carry out plan: No  Review of Systems: Psychiatric: Agitation: Yes Hallucination: No Depressed Mood: Yes 5/10 Insomnia: Yes Hypersomnia: No Altered Concentration: Yes Feels Worthless: Yes Grandiose Ideas: No Belief In Special Powers: No New/Increased Substance Abuse: No Compulsions: No  Neurologic: Headache: No Seizure: No Paresthesias: No  Past Medical Family, Social History: No changes, not currently working  Outpatient Encounter Prescriptions as of 08/09/2014  Medication Sig  . cyclobenzaprine (FLEXERIL) 10 MG tablet Take 10 mg by mouth.  . eletriptan (RELPAX) 40 MG tablet Take 40 mg by mouth.  . fluvoxaMINE (LUVOX) 100 MG tablet Take 1 tablet (100 mg total) by mouth 2 (two) times daily.  . Levothyroxine Sodium 50 MCG CAPS Take 1 capsule (50 mcg total) by mouth daily. Take first thing in the AM 30 min before breakfast.  . METRONIDAZOLE, TOPICAL, 0.75 % LOTN Apply 1 application topically 2 (two) times daily.  . Multiple Vitamin (MULTIVITAMIN) tablet Take 1 tablet by mouth daily.    Marland Kitchen. topiramate (TOPAMAX) 200 MG tablet Take 1 tablet (200 mg total) by mouth daily.    Past Psychiatric History/Hospitalization(s): Anxiety: Yes  7/10 Bipolar Disorder: No Depression: Yes Mania: No Psychosis: Yes Schizophrenia: No Personality Disorder: No Hospitalization for psychiatric illness: Yes History  of Electroconvulsive Shock Therapy: No Prior Suicide Attempts: Yes last attempt 1 year ago  Physical Exam: Constitutional:  There were no vitals taken for this visit.  General Appearance: alert, oriented, no acute distress  Musculoskeletal: Strength & Muscle Tone: decreased Gait & Station: ataxic Patient leans: N/A  Psychiatric: Speech (describe rate, volume, coherence, spontaneity, and abnormalities if any): normal  Thought Process (describe rate, content, abstract reasoning, and computation): normal  Associations: Coherent and Relevant  Thoughts: normal  Mental Status: Orientation: oriented to person, place and time/date Mood & Affect: depressed affect and anxiety Attention Span & Concentration: focused  Medical Decision Making (Choose Three): Established Problem, Stable/Improving (1) and New Problem, with no additional work-up planned (3)  Assessment: Axis I: OCD, MDD w/o medications x 1 week.  Axis II:   Axis III: New dx of Osteomyelitis  Axis IV:   Axis V:    Plan: 1. Will increase the Luvox to  Yeraldine Forney, PA-C 08/09/2014

## 2014-08-09 NOTE — Patient Instructions (Signed)
1. Continue all medication as ordered. 2. Call this office if you have any questions or concerns. 3. Continue to get regular exercise 3-5 times a week. 4. Continue to eat a healthy nutritionally balanced diet. 5. Continue to reduce stress and anxiety through activities such as yoga, mindfulness, meditation and or prayer. 6. Keep all appointments with your out patient therapist and have notes forwarded to this office. (If you do not have one and would like to be scheduled with a therapist, please let our office assist you with this. 7. Follow up as planned 3 months. 

## 2014-09-04 DIAGNOSIS — K219 Gastro-esophageal reflux disease without esophagitis: Secondary | ICD-10-CM | POA: Insufficient documentation

## 2014-09-10 ENCOUNTER — Telehealth (HOSPITAL_COMMUNITY): Payer: Self-pay | Admitting: *Deleted

## 2014-09-10 DIAGNOSIS — F429 Obsessive-compulsive disorder, unspecified: Secondary | ICD-10-CM

## 2014-09-10 DIAGNOSIS — M869 Osteomyelitis, unspecified: Secondary | ICD-10-CM

## 2014-09-10 DIAGNOSIS — F331 Major depressive disorder, recurrent, moderate: Secondary | ICD-10-CM

## 2014-09-10 MED ORDER — TOPIRAMATE 200 MG PO TABS: 200.0000 mg | ORAL_TABLET | Freq: Every day | ORAL | Status: DC

## 2014-09-10 NOTE — Telephone Encounter (Signed)
Per Verne SpurrNeil Mashburn, PA-C, pt is authorized for 1 refilled for Topiramate 200mg , Qty 30. Pt has a f/u appt 3/16. PT states and shows understanding.

## 2014-10-12 ENCOUNTER — Other Ambulatory Visit (HOSPITAL_COMMUNITY): Payer: Self-pay | Admitting: *Deleted

## 2014-10-12 DIAGNOSIS — F331 Major depressive disorder, recurrent, moderate: Secondary | ICD-10-CM

## 2014-10-12 DIAGNOSIS — M869 Osteomyelitis, unspecified: Secondary | ICD-10-CM

## 2014-10-12 DIAGNOSIS — F429 Obsessive-compulsive disorder, unspecified: Secondary | ICD-10-CM

## 2014-10-12 MED ORDER — TOPIRAMATE 200 MG PO TABS: 200.0000 mg | ORAL_TABLET | Freq: Every day | ORAL | Status: DC

## 2014-10-12 NOTE — Telephone Encounter (Signed)
Received fax from [facility] ( [address]) for a refill on Topiramate 200mg   Per Verne SpurrNeil Mashburn, PA-C, pt is authorized for a refill for Topiramate 200mg , Qty 30. Pt has f/u appt on 3/16. Pharmacy will notify pt of Rx status.

## 2014-11-07 ENCOUNTER — Encounter (INDEPENDENT_AMBULATORY_CARE_PROVIDER_SITE_OTHER): Payer: Self-pay

## 2014-11-07 ENCOUNTER — Encounter (HOSPITAL_COMMUNITY): Payer: Self-pay | Admitting: Physician Assistant

## 2014-11-07 ENCOUNTER — Ambulatory Visit (INDEPENDENT_AMBULATORY_CARE_PROVIDER_SITE_OTHER): Payer: MEDICAID | Admitting: Physician Assistant

## 2014-11-07 VITALS — BP 106/70 | HR 72 | Ht 70.0 in | Wt 158.0 lb

## 2014-11-07 DIAGNOSIS — M869 Osteomyelitis, unspecified: Secondary | ICD-10-CM

## 2014-11-07 DIAGNOSIS — F429 Obsessive-compulsive disorder, unspecified: Secondary | ICD-10-CM

## 2014-11-07 DIAGNOSIS — F4310 Post-traumatic stress disorder, unspecified: Secondary | ICD-10-CM

## 2014-11-07 DIAGNOSIS — F42 Obsessive-compulsive disorder: Secondary | ICD-10-CM

## 2014-11-07 DIAGNOSIS — F331 Major depressive disorder, recurrent, moderate: Secondary | ICD-10-CM

## 2014-11-07 DIAGNOSIS — F329 Major depressive disorder, single episode, unspecified: Secondary | ICD-10-CM

## 2014-11-07 MED ORDER — FLUVOXAMINE MALEATE 50 MG PO TABS: 50.0000 mg | ORAL_TABLET | Freq: Two times a day (BID) | ORAL | Status: DC

## 2014-11-07 MED ORDER — FLUVOXAMINE MALEATE 100 MG PO TABS
100.0000 mg | ORAL_TABLET | Freq: Two times a day (BID) | ORAL | Status: DC
Start: 2014-11-07 — End: 2015-02-07

## 2014-11-07 MED ORDER — TOPIRAMATE 200 MG PO TABS: 200.0000 mg | ORAL_TABLET | Freq: Every day | ORAL | Status: DC

## 2014-11-07 NOTE — Progress Notes (Signed)
  [facility] [id number] Progress Note  Diane Villegas S Jeangilles [id number] 44 y.o.  11/07/2014 3:22 PM  Chief Complaint: Medication management OCD and MDD  History of Present Illness: Diane Villegas is in to follow up on her OCD. She reports that she is feeling better, walking more, plans to quit smoking, and is able to enjoy reading again. She feels that she is moving forward in her recovery from the Osteomyolitis.  Her OCD symptoms are good, and she notes that as long as her anxiety is well controlled her OCD is not a problem. She notes 90% reduction in symptoms with the increase in medication on her last visit. Suicidal Ideation: No Plan Formed: No Patient has means to carry out plan: No  Homicidal Ideation: No Plan Formed: No Patient has means to carry out plan: No  Review of Systems: Psychiatric: Agitation: Yes Hallucination: No Depressed Mood: 3/10 Insomnia: no Hypersomnia: No Altered Concentration: Yes Feels Worthless: no Grandiose Ideas: No Belief In Special Powers: No New/Increased Substance Abuse: No Compulsions: No  Neurologic: Headache: No Seizure: No Paresthesias: No  Past Medical Family, Social History: 45 year old daughter recently hospitalized at [facility] for substance abuse and mood disorder due to self medicating etc.  Outpatient Encounter Prescriptions as of 08/09/2014  Medication Sig  . cyclobenzaprine (FLEXERIL) 10 MG tablet Take 10 mg by mouth.  . eletriptan (RELPAX) 40 MG tablet Take 40 mg by mouth.  . fluvoxaMINE (LUVOX) 100 MG tablet Take 1 tablet (100 mg total) by mouth 2 (two) times daily.  . Levothyroxine Sodium 50 MCG CAPS Take 1 capsule (50 mcg total) by mouth daily. Take first thing in the AM 30 min before breakfast.  . METRONIDAZOLE, TOPICAL, 0.75 % LOTN Apply 1 application topically 2 (two) times daily.  . Multiple Vitamin (MULTIVITAMIN) tablet Take 1 tablet by mouth daily.    Marland Kitchen. topiramate (TOPAMAX) 200 MG tablet Take 1 tablet (200 mg total) by  mouth daily.    Past Psychiatric History/Hospitalization(s): Anxiety: Yes   Bipolar Disorder: No Depression: Yes Mania: No Psychosis: Yes Schizophrenia: No Personality Disorder: No Hospitalization for psychiatric illness: Yes History of Electroconvulsive Shock Therapy: No Prior Suicide Attempts: Yes last attempt 1 year ago  Physical Exam: Constitutional:  BP 106/70 mmHg  Pulse 72  Ht 5\' 10"  (1.778 m)  Wt 158 lb (71.668 kg)  BMI 22.67 kg/m2  General Appearance: alert, oriented, no acute distress  Musculoskeletal: Strength & Muscle Tone: decreased Gait & Station: ataxic Patient leans: N/A  Psychiatric: Speech (describe rate, volume, coherence, spontaneity, and abnormalities if any): normal  Thought Process (describe rate, content, abstract reasoning, and computation): normal  Associations: Coherent and Relevant  Thoughts: normal  Mental Status: Orientation: oriented to person, place and time/date Mood & Affect: depressed affect and anxiety Attention Span & Concentration: focused  Medical Decision Making (Choose Three): Established Problem, Stable/Improving (1) and New Problem, with no additional work-up planned (3)  Assessment: Axis I: OCD, MDD w/o medications x 1 week.   Plan: 1. Will increase the Luvox to 150 mg po BID. 2. Will continue medications as written. 3. Follow up in 3 months. Tiaira Arambula, PA-C 11/07/2014

## 2014-11-07 NOTE — Patient Instructions (Signed)
1. Continue all medication as ordered. 2. Call this office if you have any questions or concerns. 3. Continue to get regular exercise 3-5 times a week. 4. Continue to eat a healthy nutritionally balanced diet. 5. Continue to reduce stress and anxiety through activities such as yoga, mindfulness, meditation and or prayer. 6. Keep all appointments with your out patient therapist and have notes forwarded to this office. (If you do not have one and would like to be scheduled with a therapist, please let our office assist you with this. 7. Follow up as planned 3 months. 

## 2015-02-07 ENCOUNTER — Encounter (HOSPITAL_COMMUNITY): Payer: Self-pay | Admitting: Physician Assistant

## 2015-02-07 ENCOUNTER — Ambulatory Visit (INDEPENDENT_AMBULATORY_CARE_PROVIDER_SITE_OTHER): Payer: MEDICAID | Admitting: Physician Assistant

## 2015-02-07 VITALS — BP 104/60 | HR 81 | Ht 70.0 in | Wt 158.0 lb

## 2015-02-07 DIAGNOSIS — M869 Osteomyelitis, unspecified: Secondary | ICD-10-CM

## 2015-02-07 DIAGNOSIS — F329 Major depressive disorder, single episode, unspecified: Secondary | ICD-10-CM

## 2015-02-07 DIAGNOSIS — F429 Obsessive-compulsive disorder, unspecified: Secondary | ICD-10-CM

## 2015-02-07 DIAGNOSIS — F42 Obsessive-compulsive disorder: Secondary | ICD-10-CM

## 2015-02-07 DIAGNOSIS — F331 Major depressive disorder, recurrent, moderate: Secondary | ICD-10-CM

## 2015-02-07 DIAGNOSIS — F4310 Post-traumatic stress disorder, unspecified: Secondary | ICD-10-CM

## 2015-02-07 MED ORDER — TOPIRAMATE 200 MG PO TABS: 200.0000 mg | ORAL_TABLET | Freq: Every day | ORAL | Status: DC

## 2015-02-07 MED ORDER — FLUVOXAMINE MALEATE 100 MG PO TABS: 100.0000 mg | ORAL_TABLET | Freq: Two times a day (BID) | ORAL | Status: DC

## 2015-02-07 MED ORDER — FLUVOXAMINE MALEATE 50 MG PO TABS: 50.0000 mg | ORAL_TABLET | Freq: Two times a day (BID) | ORAL | Status: DC

## 2015-02-07 NOTE — Progress Notes (Signed)
Patient ID: Diane Villegas, female   DOB: 1970/08/31, 44 y.o.   MRN: [id number]  [facility] [id number] Progress Note  Diane Villegas [id number] 44 y.o.  02/07/2015 3:21 PM  Chief Complaint: Medication management OCD and MDD  History of Present Illness: Diane Villegas is in to follow up on her OCD and MDD. Currently lots of family issues going on, her aunt recently passed away, her daughter's father is keeping her from seeing her youngest. She is having to pitch in and help her mother take care of her father, her mother was her aunt's care giver. They are planning the funeral. She notes that she has been anxious daily, has to struggle to get out of bed, has to push herself to do daily chores. Was feeling overwhelmed with sadness over not being able to speak with her daughter, but she has pushed forward and tough things out, did not go to the hospital, and did not lose control due to her family needs. She is taking her medication as written, her family is being supportive, they are trying to check in with her often. The situation is resolving and she is now able to speak with her daughter again. This is helping. They meet at her parents house.  Suicidal Ideation: No Plan Formed: No Patient has means to carry out plan: No  Homicidal Ideation: No Plan Formed: No Patient has means to carry out plan: No  Review of Systems: Psychiatric: Agitation: Yes Hallucination: No Depressed Mood: 6/10 Insomnia: no Hypersomnia: No Altered Concentration: Yes Feels Worthless: no Grandiose Ideas: No Belief In Special Powers: No New/Increased Substance Abuse: No Compulsions: No  Neurologic: Headache: No Seizure: No Paresthesias: No  Past Medical Family, Social History:  Her 53 year old daughter is doing well, but also now expecting a baby, but she is clean and keeping her prenatal appointments and working.  Mom notes that she is really turning herself around.  Outpatient Encounter Prescriptions as of  08/09/2014  Medication Sig  . cyclobenzaprine (FLEXERIL) 10 MG tablet Take 10 mg by mouth.  . eletriptan (RELPAX) 40 MG tablet Take 40 mg by mouth.  . fluvoxaMINE (LUVOX) 100 MG tablet Take 1 tablet (100 mg total) by mouth 2 (two) times daily.  . Levothyroxine Sodium 50 MCG CAPS Take 1 capsule (50 mcg total) by mouth daily. Take first thing in the AM 30 min before breakfast.  . METRONIDAZOLE, TOPICAL, 0.75 % LOTN Apply 1 application topically 2 (two) times daily.  . Multiple Vitamin (MULTIVITAMIN) tablet Take 1 tablet by mouth daily.    Marland Kitchen topiramate (TOPAMAX) 200 MG tablet Take 1 tablet (200 mg total) by mouth daily.    Past Psychiatric History/Hospitalization(s): Anxiety: Yes   Bipolar Disorder: No Depression: Yes Mania: No Psychosis: Yes Schizophrenia: No Personality Disorder: No Hospitalization for psychiatric illness: Yes History of Electroconvulsive Shock Therapy: No Prior Suicide Attempts: Yes last attempt 1 year ago  Physical Exam: Constitutional:  BP 104/60 mmHg  Pulse 81  Ht 5\' 10"  (1.778 m)  Wt 158 lb (71.668 kg)  BMI 22.67 kg/m2  SpO2 94%  General Appearance: alert, oriented, no acute distress  Musculoskeletal: Strength & Muscle Tone: decreased Gait & Station: ataxic Patient leans: NA  Psychiatric Specialty Exam: Physical Exam  ROS  Blood pressure 104/60, pulse 81, height 5\' 10"  (1.778 m), weight 158 lb (71.668 kg), SpO2 94 %.Body mass index is 22.67 kg/(m^2).  General Appearance: Well Groomed  Eye Contact::  Good  Speech:  Clear and Coherent  and Normal Rate  Volume:  Normal  Mood:  Anxious  Affect:  Congruent  Thought Process:  Coherent, Goal Directed, Intact, Linear and Logical  Orientation:  Full (Time, Place, and Person)  Thought Content:  WDL  Suicidal Thoughts:  No  Homicidal Thoughts:  No  Memory:  Immediate;   Good Recent;   Good Remote;   Good  Judgement:  Good  Insight:  Present  Psychomotor Activity:  Normal  Concentration:  Good   Recall:  Good  Fund of Knowledge:Good  Language: Good  Akathisia:  No  Handed:  Right  AIMS (if indicated):     Assets:  Communication Skills Desire for Improvement Housing Leisure Time Resilience Social Support Talents/Skills Transportation Vocational/Educational  Sleep:       Musculoskeletal: Strength & Muscle Tone: within normal limits Gait & Station: normal Patient leans: normal  Medical Decision Making (Choose Three): Established Problem, Stable/Improving (1) and New Problem, with no additional work-up planned (3)  Assessment: Axis I: OCD, MDD            Situational depression   Plan: 1. Will continue the Luvox to 150 mg po BID. 2. Will continue medications as written. 3. Follow up in 2 months.  Christy Friede, PA-C 02/07/2015

## 2015-02-07 NOTE — Patient Instructions (Signed)
1. Continue all medication as ordered. 2. Call this office if you have any questions or concerns. 3. Continue to get regular exercise 3-5 times a week. 4. Continue to eat a healthy nutritionally balanced diet. 5. Continue to reduce stress and anxiety through activities such as yoga, mindfulness, meditation and or prayer. 6. Keep all appointments with your out patient therapist and have notes forwarded to this office. (If you do not have one and would like to be scheduled with a therapist, please let our office assist you with this.) 7. Follow up as planned 2 months. 

## 2015-04-04 ENCOUNTER — Ambulatory Visit (HOSPITAL_COMMUNITY): Payer: Self-pay | Admitting: Psychiatry

## 2015-04-23 DIAGNOSIS — K8050 Calculus of bile duct without cholangitis or cholecystitis without obstruction: Secondary | ICD-10-CM | POA: Insufficient documentation

## 2015-05-23 ENCOUNTER — Encounter (HOSPITAL_COMMUNITY): Payer: Self-pay | Admitting: Psychiatry

## 2015-05-23 ENCOUNTER — Ambulatory Visit (INDEPENDENT_AMBULATORY_CARE_PROVIDER_SITE_OTHER): Payer: Medicaid Other | Admitting: Psychiatry

## 2015-05-23 DIAGNOSIS — F4310 Post-traumatic stress disorder, unspecified: Secondary | ICD-10-CM | POA: Diagnosis not present

## 2015-05-23 DIAGNOSIS — F331 Major depressive disorder, recurrent, moderate: Secondary | ICD-10-CM | POA: Diagnosis not present

## 2015-05-23 DIAGNOSIS — F42 Obsessive-compulsive disorder: Secondary | ICD-10-CM

## 2015-05-23 DIAGNOSIS — F429 Obsessive-compulsive disorder, unspecified: Secondary | ICD-10-CM

## 2015-05-23 MED ORDER — FLUVOXAMINE MALEATE 100 MG PO TABS: 100.0000 mg | ORAL_TABLET | Freq: Two times a day (BID) | ORAL | Status: DC

## 2015-05-23 MED ORDER — TOPIRAMATE 200 MG PO TABS: 200.0000 mg | ORAL_TABLET | Freq: Every day | ORAL | Status: DC

## 2015-05-23 MED ORDER — FLUVOXAMINE MALEATE 50 MG PO TABS: 50.0000 mg | ORAL_TABLET | Freq: Two times a day (BID) | ORAL | Status: DC
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
## 2015-08-04 ENCOUNTER — Other Ambulatory Visit (HOSPITAL_COMMUNITY): Payer: Self-pay | Admitting: Psychiatry

## 2015-08-05 NOTE — Telephone Encounter (Signed)
Received medication refill request for Topamax 200mg , Luvox 50mg  and Luvox 100mg . Per Dr. Gilmore LarocheAkhtar, medication request is denied. Pt has request medication early. Pt is not due for a refill until 08/22/15. Pt has a f/u appt on 08/22/15.

## 2015-08-22 ENCOUNTER — Ambulatory Visit (INDEPENDENT_AMBULATORY_CARE_PROVIDER_SITE_OTHER): Payer: Medicaid Other | Admitting: Psychiatry

## 2015-08-22 ENCOUNTER — Encounter (HOSPITAL_COMMUNITY): Payer: Self-pay | Admitting: Psychiatry

## 2015-08-22 VITALS — BP 90/65 | HR 72

## 2015-08-22 DIAGNOSIS — F331 Major depressive disorder, recurrent, moderate: Secondary | ICD-10-CM

## 2015-08-22 DIAGNOSIS — F429 Obsessive-compulsive disorder, unspecified: Secondary | ICD-10-CM

## 2015-08-22 DIAGNOSIS — F4310 Post-traumatic stress disorder, unspecified: Secondary | ICD-10-CM

## 2015-08-22 DIAGNOSIS — Z634 Disappearance and death of family member: Secondary | ICD-10-CM

## 2015-08-22 MED ORDER — TOPIRAMATE 200 MG PO TABS: 200.0000 mg | ORAL_TABLET | Freq: Every day | ORAL | Status: DC

## 2015-08-22 MED ORDER — FLUVOXAMINE MALEATE 100 MG PO TABS: 100.0000 mg | ORAL_TABLET | Freq: Two times a day (BID) | ORAL | Status: DC

## 2015-08-22 MED ORDER — FLUVOXAMINE MALEATE 50 MG PO TABS: 50.0000 mg | ORAL_TABLET | Freq: Two times a day (BID) | ORAL | Status: DC

## 2015-08-22 MED ORDER — BUSPIRONE HCL 7.5 MG PO TABS
7.5000 mg | ORAL_TABLET | Freq: Two times a day (BID) | ORAL | Status: DC
Start: 2015-08-22 — End: 2015-09-19
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
## 2015-09-05 ENCOUNTER — Other Ambulatory Visit (HOSPITAL_COMMUNITY): Payer: Self-pay | Admitting: Psychiatry

## 2015-09-09 NOTE — Telephone Encounter (Signed)
Received medication request from [facility] for Luvox and Topamax. Per Dr. Gilmore LarocheAkhtar, medication request denied. Pt received medication refills on 08/22/15.  Pt is schedule for a f/u appt on 09/19/15.

## 2015-09-19 ENCOUNTER — Ambulatory Visit (INDEPENDENT_AMBULATORY_CARE_PROVIDER_SITE_OTHER): Payer: Self-pay | Admitting: Psychiatry

## 2015-09-19 ENCOUNTER — Encounter (HOSPITAL_COMMUNITY): Payer: Self-pay | Admitting: Psychiatry

## 2015-09-19 VITALS — BP 108/72 | HR 84 | Ht 70.0 in | Wt 151.2 lb

## 2015-09-19 DIAGNOSIS — F331 Major depressive disorder, recurrent, moderate: Secondary | ICD-10-CM

## 2015-09-19 DIAGNOSIS — F429 Obsessive-compulsive disorder, unspecified: Secondary | ICD-10-CM

## 2015-09-19 DIAGNOSIS — Z634 Disappearance and death of family member: Secondary | ICD-10-CM

## 2015-09-19 DIAGNOSIS — F4310 Post-traumatic stress disorder, unspecified: Secondary | ICD-10-CM

## 2015-09-19 MED ORDER — TRAZODONE HCL 50 MG PO TABS: 50.0000 mg | ORAL_TABLET | Freq: Every day | ORAL | Status: DC

## 2015-09-19 MED ORDER — BUSPIRONE HCL 10 MG PO TABS: 10.0000 mg | ORAL_TABLET | Freq: Two times a day (BID) | ORAL | Status: DC

## 2015-09-19 MED ORDER — FLUVOXAMINE MALEATE 100 MG PO TABS: 100.0000 mg | ORAL_TABLET | Freq: Two times a day (BID) | ORAL | Status: DC

## 2015-09-19 MED ORDER — FLUVOXAMINE MALEATE 50 MG PO TABS: 50.0000 mg | ORAL_TABLET | Freq: Two times a day (BID) | ORAL | Status: DC

## 2015-09-19 MED ORDER — TOPIRAMATE 200 MG PO TABS: 200.0000 mg | ORAL_TABLET | Freq: Every day | ORAL | Status: DC
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
## 2015-10-05 ENCOUNTER — Other Ambulatory Visit (HOSPITAL_COMMUNITY): Payer: Self-pay | Admitting: Psychiatry

## 2015-10-14 NOTE — Telephone Encounter (Signed)
Received medication request from [facility] for Bupropion, Topiramate, and Fluvoxamine Maleate. Per Dr. Gilmore Laroche medication request is denied. Pt has requested medication refills too soon. Pt is schedule for a f/u appt on 10/17/15.

## 2015-10-17 ENCOUNTER — Ambulatory Visit (HOSPITAL_COMMUNITY): Payer: Self-pay | Admitting: Psychiatry

## 2015-10-30 ENCOUNTER — Telehealth (HOSPITAL_COMMUNITY): Payer: Self-pay | Admitting: *Deleted

## 2015-10-30 DIAGNOSIS — F4310 Post-traumatic stress disorder, unspecified: Secondary | ICD-10-CM

## 2015-10-30 DIAGNOSIS — F429 Obsessive-compulsive disorder, unspecified: Secondary | ICD-10-CM

## 2015-10-30 DIAGNOSIS — F331 Major depressive disorder, recurrent, moderate: Secondary | ICD-10-CM

## 2015-10-30 NOTE — Telephone Encounter (Signed)
Pt called for medication refills for Luvox, Buspar, Topamax and Trazodone. Pt stats she is unable to schedule appt due to loss of insurance. Pt is currently out of medications. Pt is schedule [facility] and will need enough refills until appointment.

## 2015-10-31 MED ORDER — FLUVOXAMINE MALEATE 100 MG PO TABS
100.0000 mg | ORAL_TABLET | Freq: Two times a day (BID) | ORAL | Status: DC
Start: 2015-10-31 — End: 2016-12-11

## 2015-10-31 MED ORDER — FLUVOXAMINE MALEATE 50 MG PO TABS: 50.0000 mg | ORAL_TABLET | Freq: Two times a day (BID) | ORAL | Status: DC

## 2015-10-31 MED ORDER — BUSPIRONE HCL 10 MG PO TABS: 10.0000 mg | ORAL_TABLET | Freq: Two times a day (BID) | ORAL | Status: AC

## 2015-10-31 MED ORDER — TRAZODONE HCL 50 MG PO TABS: 50.0000 mg | ORAL_TABLET | Freq: Every day | ORAL | Status: DC

## 2015-10-31 MED ORDER — TOPIRAMATE 200 MG PO TABS: 200.0000 mg | ORAL_TABLET | Freq: Every day | ORAL | Status: DC

## 2015-10-31 NOTE — Telephone Encounter (Signed)
Return telephone call to pt. Per Dr. Gilmore LarocheAkhtar, pt is authorized for refills for Luvox 50mg , #60, Luvox 100mg , # 60, Buspar 10mg , #60, Topamax 200mg , #30 and Trazodone  50mg , #30. Prescriptions were sent to pharmacy. Called and informed pt of prescriptions status. Pt v verbalize understanding.

## 2015-11-04 ENCOUNTER — Other Ambulatory Visit (HOSPITAL_COMMUNITY): Payer: Self-pay | Admitting: Psychiatry

## 2015-11-04 NOTE — Telephone Encounter (Signed)
[facility]. Pre Dr. Gilmore LarocheAkhtar, medication request is denied. Refill for Luvox 50mg  and Luvox 100mg  were sent to pharmacy on 10/30/15.

## 2015-12-04 ENCOUNTER — Other Ambulatory Visit (HOSPITAL_COMMUNITY): Payer: Self-pay | Admitting: Psychiatry

## 2015-12-04 NOTE — Telephone Encounter (Signed)
[facility]  and Luvox 100mg . Per Dr. Gilmore LarocheAkhtar, medication request is denied. Pt will need to schedule an appt with clinic. LVM for pt to return call to the office.

## 2016-01-03 ENCOUNTER — Other Ambulatory Visit (HOSPITAL_COMMUNITY): Payer: Self-pay | Admitting: Psychiatry

## 2016-01-07 ENCOUNTER — Encounter (HOSPITAL_COMMUNITY): Payer: Self-pay | Admitting: *Deleted

## 2016-01-07 NOTE — Telephone Encounter (Signed)
[facility]. Per Dr. Gilmore LarocheAkhtar, medication request is denied. Pt [facility] due to insurance effective March 2017.

## 2016-02-02 ENCOUNTER — Other Ambulatory Visit (HOSPITAL_COMMUNITY): Payer: Self-pay | Admitting: Psychiatry

## 2016-02-03 NOTE — Telephone Encounter (Signed)
[facility] Lu[facility]. Per Dr. Gilmore LarocheAkhtar, medication request is denied. Pt [facility] effective March 2017.[facility]ed and notified [facility], pt is no longer Dr. Lavella LemonsAkhtar's care.

## 2016-03-03 ENCOUNTER — Other Ambulatory Visit (HOSPITAL_COMMUNITY): Payer: Self-pay | Admitting: Psychiatry

## 2016-03-04 NOTE — Telephone Encounter (Signed)
[facility] Luvox. [facility]essa [facility]. Informed pharmacy the pt is no longer under Dr. Lavella LemonsAkhtar's care effective 01/07/2016.

## 2016-04-02 ENCOUNTER — Other Ambulatory Visit (HOSPITAL_COMMUNITY): Payer: Self-pay | Admitting: Psychiatry

## 2016-04-02 DIAGNOSIS — F331 Major depressive disorder, recurrent, moderate: Secondary | ICD-10-CM

## 2016-04-02 DIAGNOSIS — F429 Obsessive-compulsive disorder, unspecified: Secondary | ICD-10-CM

## 2016-04-02 DIAGNOSIS — F4310 Post-traumatic stress disorder, unspecified: Secondary | ICD-10-CM

## 2016-04-02 NOTE — Telephone Encounter (Signed)
[facility] and Luvox. Spoke w/ Paediatric nurseLogan at [facility]. Informed pharmacy the pt is no longer under Dr. Lavella LemonsAkhtar's care effective 01/07/2016.

## 2016-08-24 ENCOUNTER — Emergency Department
Admission: EM | Admit: 2016-08-24 | Discharge: 2016-08-24 | Disposition: A | Discharge status: Home / Self Care | Payer: Managed Care, Other (non HMO) | Attending: Emergency Medicine | Admitting: Emergency Medicine

## 2016-08-24 DIAGNOSIS — R1011 Right upper quadrant pain: Secondary | ICD-10-CM | POA: Insufficient documentation

## 2016-08-24 DIAGNOSIS — E039 Hypothyroidism, unspecified: Secondary | ICD-10-CM | POA: Diagnosis not present

## 2016-08-24 DIAGNOSIS — R109 Unspecified abdominal pain: Secondary | ICD-10-CM

## 2016-08-24 DIAGNOSIS — F17210 Nicotine dependence, cigarettes, uncomplicated: Secondary | ICD-10-CM | POA: Diagnosis not present

## 2016-08-24 DIAGNOSIS — Z79899 Other long term (current) drug therapy: Secondary | ICD-10-CM | POA: Diagnosis not present

## 2016-08-24 LAB — CBC
HEMATOCRIT: 40.9 % (ref 35.0–47.0)
HEMOGLOBIN: 14.1 g/dL (ref 12.0–16.0)
MCH: 31.3 pg (ref 26.0–34.0)
MCHC: 34.4 g/dL (ref 32.0–36.0)
MCV: 91 fL (ref 80.0–100.0)
Platelets: 232 10*3/uL (ref 150–440)
RBC: 4.49 MIL/uL (ref 3.80–5.20)
RDW: 12.7 % (ref 11.5–14.5)
WBC: 9.3 10*3/uL (ref 3.6–11.0)

## 2016-08-24 LAB — COMPREHENSIVE METABOLIC PANEL
ALT: 15 U/L (ref 14–54)
ANION GAP: 6 (ref 5–15)
AST: 21 U/L (ref 15–41)
Albumin: 4.4 g/dL (ref 3.5–5.0)
Alkaline Phosphatase: 75 U/L (ref 38–126)
BILIRUBIN TOTAL: 1 mg/dL (ref 0.3–1.2)
BUN: 14 mg/dL (ref 6–20)
CALCIUM: 8.5 mg/dL — AB (ref 8.9–10.3)
CO2: 25 mmol/L (ref 22–32)
Chloride: 104 mmol/L (ref 101–111)
Creatinine, Ser: 0.96 mg/dL (ref 0.44–1.00)
GFR calc non Af Amer: >60 mL/min (ref >60)
GLUCOSE: 98 mg/dL (ref 65–99)
POTASSIUM: 3.7 mmol/L (ref 3.5–5.1)
SODIUM: 135 mmol/L (ref 135–145)
TOTAL PROTEIN: 7 g/dL (ref 6.5–8.1)

## 2016-08-24 LAB — URINALYSIS, COMPLETE (UACMP) WITH MICROSCOPIC
BACTERIA UA: NONE SEEN
BILIRUBIN URINE: NEGATIVE
Glucose, UA: NEGATIVE mg/dL
HGB URINE DIPSTICK: NEGATIVE
KETONES UR: 20 mg/dL — AB
LEUKOCYTES UA: NEGATIVE
NITRITE: NEGATIVE
PROTEIN: 30 mg/dL — AB
Specific Gravity, Urine: 1.018 (ref 1.005–1.030)
pH: 7 (ref 5.0–8.0)

## 2016-08-24 LAB — LIPASE, BLOOD: Lipase: 16 U/L (ref 11–51)

## 2016-08-24 MED ORDER — PROCHLORPERAZINE EDISYLATE 5 MG/ML IJ SOLN
10.0000 mg | Freq: Once | INTRAMUSCULAR | Status: AC
  Administered 2016-08-24: 10 mg via INTRAVENOUS
  Filled 2016-08-24: qty 2

## 2016-08-24 MED ORDER — SODIUM CHLORIDE 0.9 % IV BOLUS (SEPSIS)
1000.0000 mL | Freq: Once | INTRAVENOUS | Status: AC
  Administered 2016-08-24: 1000 mL via INTRAVENOUS

## 2016-08-24 MED ORDER — DICYCLOMINE HCL 10 MG/ML IM SOLN
20.0000 mg | Freq: Once | INTRAMUSCULAR | Status: AC
  Administered 2016-08-24: 20 mg via INTRAMUSCULAR
  Filled 2016-08-24: qty 2

## 2016-08-24 NOTE — ED Notes (Signed)
Pt. Signed out AMA with no animosity towards any staff and verbalized thanks.
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
## 2016-08-24 NOTE — ED Triage Notes (Signed)
Pt c/o RLQ and right side pain that began yesterday. Pt alert and oriented X4, active, cooperative, pt in NAD. RR even and unlabored, color WNL.

## 2016-08-24 NOTE — ED Notes (Signed)
Pt. States pain similar to her gall bladder prior to removal.

## 2016-08-26 ENCOUNTER — Encounter: Payer: Self-pay | Admitting: Physician Assistant

## 2016-08-26 ENCOUNTER — Ambulatory Visit: Payer: Self-pay | Admitting: Physician Assistant

## 2016-08-26 VITALS — BP 115/58 | HR 97 | Temp 97.7°F

## 2016-08-26 DIAGNOSIS — J069 Acute upper respiratory infection, unspecified: Secondary | ICD-10-CM

## 2016-08-26 MED ORDER — AMOXICILLIN-POT CLAVULANATE 875-125 MG PO TABS: 1.0000 | ORAL_TABLET | Freq: Two times a day (BID) | ORAL | 0 refills | Status: DC

## 2016-08-26 MED ORDER — FLUCONAZOLE 150 MG PO TABS: ORAL_TABLET | ORAL | 0 refills | Status: DC

## 2016-08-26 NOTE — Progress Notes (Signed)
S: C/o runny nose and congestion with dry cough, sometimes productive with yellow mucus for 6 days, no fever, chills, cp/sob, v/d; using mucinex which has helped break up the phlegm, hx of pneumonia multiple times and her pneumonia vaccine was over 5 years ago, + smoker; Using otc meds: robitussin  O: PE: vitals wnl, nad, perrl eomi, normocephalic, tms dull, nasal mucosa red and swollen, throat injected, neck supple no lymph, lungs c t a, cv rrr, neuro intact  A:  Acute uri   P: drink fluids, continue regular meds , use otc meds of choice, return if not improving in 5 days, return earlier if worsening, augmentin 875, diflucan

## 2016-08-29 ENCOUNTER — Encounter: Payer: Self-pay | Admitting: Emergency Medicine

## 2016-08-29 ENCOUNTER — Emergency Department: Payer: Managed Care, Other (non HMO)

## 2016-08-29 ENCOUNTER — Encounter
Admission: EM | Disposition: A | Discharge status: Home Health | Payer: Self-pay | Source: Home / Self Care | Attending: Surgery

## 2016-08-29 ENCOUNTER — Inpatient Hospital Stay
Admission: EM | Admit: 2016-08-29 | Discharge: 2016-09-01 | DRG: 481 | Disposition: A | Discharge status: Home Health | Payer: Managed Care, Other (non HMO) | Attending: Surgery | Admitting: Surgery

## 2016-08-29 ENCOUNTER — Emergency Department: Payer: Managed Care, Other (non HMO) | Admitting: Anesthesiology

## 2016-08-29 DIAGNOSIS — Z9049 Acquired absence of other specified parts of digestive tract: Secondary | ICD-10-CM

## 2016-08-29 DIAGNOSIS — Z79899 Other long term (current) drug therapy: Secondary | ICD-10-CM | POA: Diagnosis not present

## 2016-08-29 DIAGNOSIS — M25552 Pain in left hip: Secondary | ICD-10-CM | POA: Diagnosis present

## 2016-08-29 DIAGNOSIS — D62 Acute posthemorrhagic anemia: Secondary | ICD-10-CM | POA: Diagnosis not present

## 2016-08-29 DIAGNOSIS — Y93K1 Activity, walking an animal: Secondary | ICD-10-CM | POA: Diagnosis not present

## 2016-08-29 DIAGNOSIS — F17210 Nicotine dependence, cigarettes, uncomplicated: Secondary | ICD-10-CM | POA: Diagnosis present

## 2016-08-29 DIAGNOSIS — Z8661 Personal history of infections of the central nervous system: Secondary | ICD-10-CM

## 2016-08-29 DIAGNOSIS — K219 Gastro-esophageal reflux disease without esophagitis: Secondary | ICD-10-CM | POA: Diagnosis present

## 2016-08-29 DIAGNOSIS — S7222XA Displaced subtrochanteric fracture of left femur, initial encounter for closed fracture: Secondary | ICD-10-CM | POA: Diagnosis present

## 2016-08-29 DIAGNOSIS — Y929 Unspecified place or not applicable: Secondary | ICD-10-CM

## 2016-08-29 DIAGNOSIS — S72002A Fracture of unspecified part of neck of left femur, initial encounter for closed fracture: Secondary | ICD-10-CM

## 2016-08-29 DIAGNOSIS — F329 Major depressive disorder, single episode, unspecified: Secondary | ICD-10-CM | POA: Diagnosis present

## 2016-08-29 DIAGNOSIS — R2681 Unsteadiness on feet: Secondary | ICD-10-CM

## 2016-08-29 DIAGNOSIS — W010XXA Fall on same level from slipping, tripping and stumbling without subsequent striking against object, initial encounter: Secondary | ICD-10-CM | POA: Diagnosis present

## 2016-08-29 HISTORY — PX: INTRAMEDULLARY (IM) NAIL INTERTROCHANTERIC: SHX5875

## 2016-08-29 LAB — CBC WITH DIFFERENTIAL/PLATELET
BASOS ABS: 0 10*3/uL (ref 0–0.1)
BASOS PCT: 1 %
EOS PCT: 8 %
Eosinophils Absolute: 0.6 10*3/uL (ref 0–0.7)
HCT: 39.7 % (ref 35.0–47.0)
Hemoglobin: 13.6 g/dL (ref 12.0–16.0)
Lymphocytes Relative: 47 %
Lymphs Abs: 3.5 10*3/uL (ref 1.0–3.6)
MCH: 31.7 pg (ref 26.0–34.0)
MCHC: 34.3 g/dL (ref 32.0–36.0)
MCV: 92.3 fL (ref 80.0–100.0)
MONO ABS: 0.4 10*3/uL (ref 0.2–0.9)
Monocytes Relative: 5 %
Neutro Abs: 2.8 10*3/uL (ref 1.4–6.5)
Neutrophils Relative %: 39 %
PLATELETS: 311 10*3/uL (ref 150–440)
RBC: 4.3 MIL/uL (ref 3.80–5.20)
RDW: 12.5 % (ref 11.5–14.5)
WBC: 7.2 10*3/uL (ref 3.6–11.0)

## 2016-08-29 LAB — BASIC METABOLIC PANEL
ANION GAP: 5 (ref 5–15)
BUN: 13 mg/dL (ref 6–20)
CO2: 21 mmol/L — ABNORMAL LOW (ref 22–32)
Calcium: 8.6 mg/dL — ABNORMAL LOW (ref 8.9–10.3)
Chloride: 113 mmol/L — ABNORMAL HIGH (ref 101–111)
Creatinine, Ser: 0.93 mg/dL (ref 0.44–1.00)
GFR calc Af Amer: >60 mL/min (ref >60)
GLUCOSE: 110 mg/dL — AB (ref 65–99)
Potassium: 4.1 mmol/L (ref 3.5–5.1)
SODIUM: 139 mmol/L (ref 135–145)

## 2016-08-29 LAB — PROTIME-INR
INR: 0.92
PROTHROMBIN TIME: 12.4 s (ref 11.4–15.2)

## 2016-08-29 LAB — TYPE AND SCREEN
ABO/RH(D): O POS
Antibody Screen: NEGATIVE

## 2016-08-29 SURGERY — FIXATION, FRACTURE, INTERTROCHANTERIC, WITH INTRAMEDULLARY ROD
Anesthesia: General | Laterality: Left

## 2016-08-29 SURGERY — Surgical Case
Anesthesia: *Unknown

## 2016-08-29 MED ORDER — NEOMYCIN-POLYMYXIN B GU 40-200000 IR SOLN
Status: DC | PRN
  Administered 2016-08-29: 2 mL

## 2016-08-29 MED ORDER — BUSPIRONE HCL 10 MG PO TABS
15.0000 mg | ORAL_TABLET | Freq: Two times a day (BID) | ORAL | Status: DC
  Administered 2016-08-29 – 2016-09-01 (×6): 15 mg via ORAL
  Filled 2016-08-29 (×6): qty 1

## 2016-08-29 MED ORDER — HYDROMORPHONE HCL 1 MG/ML IJ SOLN
INTRAMUSCULAR | Status: AC
  Administered 2016-08-29: 0.5 mg via INTRAVENOUS
  Filled 2016-08-29: qty 1

## 2016-08-29 MED ORDER — MIDAZOLAM HCL 2 MG/2ML IJ SOLN
INTRAMUSCULAR | Status: DC | PRN
  Administered 2016-08-29: 2 mg via INTRAVENOUS

## 2016-08-29 MED ORDER — CLINDAMYCIN PHOSPHATE 600 MG/50ML IV SOLN
INTRAVENOUS | Status: AC
  Filled 2016-08-29: qty 50

## 2016-08-29 MED ORDER — PHENYLEPHRINE HCL 10 MG/ML IJ SOLN
INTRAMUSCULAR | Status: DC | PRN
  Administered 2016-08-29: 40 ug via INTRAVENOUS
  Administered 2016-08-29 (×2): 80 ug via INTRAVENOUS

## 2016-08-29 MED ORDER — VASOPRESSIN 20 UNIT/ML IV SOLN
INTRAVENOUS | Status: DC | PRN
  Administered 2016-08-29: 2 [IU] via INTRAVENOUS
  Administered 2016-08-29 (×2): 1 [IU] via INTRAVENOUS

## 2016-08-29 MED ORDER — SODIUM CHLORIDE 0.9 % IV SOLN: INTRAVENOUS | Status: DC | PRN

## 2016-08-29 MED ORDER — FENTANYL CITRATE (PF) 100 MCG/2ML IJ SOLN
INTRAMUSCULAR | Status: AC
  Filled 2016-08-29: qty 2

## 2016-08-29 MED ORDER — FENTANYL CITRATE (PF) 100 MCG/2ML IJ SOLN
INTRAMUSCULAR | Status: DC | PRN
  Administered 2016-08-29 (×2): 50 ug via INTRAVENOUS

## 2016-08-29 MED ORDER — HYDROMORPHONE HCL 1 MG/ML IJ SOLN
0.5000 mg | Freq: Once | INTRAMUSCULAR | Status: AC
  Administered 2016-08-29: 0.5 mg via INTRAVENOUS

## 2016-08-29 MED ORDER — HYDROMORPHONE HCL 1 MG/ML IJ SOLN
0.5000 mg | Freq: Once | INTRAMUSCULAR | Status: AC
  Administered 2016-08-29: 0.5 mg via INTRAVENOUS
  Filled 2016-08-29: qty 1

## 2016-08-29 MED ORDER — CLINDAMYCIN PHOSPHATE 900 MG/50ML IV SOLN
900.0000 mg | Freq: Four times a day (QID) | INTRAVENOUS | Status: AC
  Administered 2016-08-29 – 2016-08-30 (×3): 900 mg via INTRAVENOUS
  Filled 2016-08-29 (×3): qty 50

## 2016-08-29 MED ORDER — MIDAZOLAM HCL 2 MG/2ML IJ SOLN
INTRAMUSCULAR | Status: AC
  Filled 2016-08-29: qty 2

## 2016-08-29 MED ORDER — HYDROMORPHONE HCL 2 MG PO TABS
2.0000 mg | ORAL_TABLET | ORAL | Status: DC | PRN
  Administered 2016-08-30 (×4): 2 mg via ORAL
  Filled 2016-08-29 (×4): qty 1

## 2016-08-29 MED ORDER — ENOXAPARIN SODIUM 40 MG/0.4ML ~~LOC~~ SOLN
40.0000 mg | SUBCUTANEOUS | Status: DC
  Administered 2016-08-30 – 2016-09-01 (×3): 40 mg via SUBCUTANEOUS
  Filled 2016-08-29 (×3): qty 0.4

## 2016-08-29 MED ORDER — KETOROLAC TROMETHAMINE 15 MG/ML IJ SOLN
15.0000 mg | Freq: Four times a day (QID) | INTRAMUSCULAR | Status: AC
  Administered 2016-08-29 – 2016-08-30 (×3): 15 mg via INTRAVENOUS
  Filled 2016-08-29 (×4): qty 1

## 2016-08-29 MED ORDER — METOCLOPRAMIDE HCL 5 MG/ML IJ SOLN: 5.0000 mg | Freq: Three times a day (TID) | INTRAMUSCULAR | Status: DC | PRN

## 2016-08-29 MED ORDER — BUPIVACAINE HCL (PF) 0.5 % IJ SOLN
INTRAMUSCULAR | Status: AC
  Filled 2016-08-29: qty 30

## 2016-08-29 MED ORDER — FLUVOXAMINE MALEATE 50 MG PO TABS: 50.0000 mg | ORAL_TABLET | Freq: Two times a day (BID) | ORAL | Status: DC

## 2016-08-29 MED ORDER — FLUVOXAMINE MALEATE 100 MG PO TABS
100.0000 mg | ORAL_TABLET | Freq: Two times a day (BID) | ORAL | Status: DC
  Filled 2016-08-29: qty 1

## 2016-08-29 MED ORDER — ONDANSETRON HCL 4 MG/2ML IJ SOLN: 4.0000 mg | Freq: Once | INTRAMUSCULAR | Status: DC | PRN

## 2016-08-29 MED ORDER — LACTATED RINGERS IV SOLN
INTRAVENOUS | Status: DC | PRN
  Administered 2016-08-29 (×2): via INTRAVENOUS

## 2016-08-29 MED ORDER — ONDANSETRON HCL 4 MG PO TABS: 4.0000 mg | ORAL_TABLET | Freq: Four times a day (QID) | ORAL | Status: DC | PRN

## 2016-08-29 MED ORDER — VASOPRESSIN 20 UNIT/ML IV SOLN
INTRAVENOUS | Status: AC
  Filled 2016-08-29: qty 1

## 2016-08-29 MED ORDER — KCL IN DEXTROSE-NACL 20-5-0.9 MEQ/L-%-% IV SOLN
INTRAVENOUS | Status: DC
  Administered 2016-08-29: 100 mL/h via INTRAVENOUS
  Administered 2016-08-30 – 2016-08-31 (×3): via INTRAVENOUS
  Filled 2016-08-29 (×9): qty 1000

## 2016-08-29 MED ORDER — NEOMYCIN-POLYMYXIN B GU 40-200000 IR SOLN
Status: AC
  Filled 2016-08-29: qty 2

## 2016-08-29 MED ORDER — KETOROLAC TROMETHAMINE 15 MG/ML IJ SOLN
30.0000 mg | Freq: Once | INTRAMUSCULAR | Status: AC
  Administered 2016-08-29: 30 mg via INTRAVENOUS
  Filled 2016-08-29: qty 2

## 2016-08-29 MED ORDER — CEFAZOLIN SODIUM 1 G IJ SOLR
INTRAMUSCULAR | Status: AC
  Filled 2016-08-29: qty 20

## 2016-08-29 MED ORDER — HYDROMORPHONE HCL 1 MG/ML IJ SOLN
INTRAMUSCULAR | Status: AC
  Filled 2016-08-29: qty 1

## 2016-08-29 MED ORDER — DOCUSATE SODIUM 100 MG PO CAPS
100.0000 mg | ORAL_CAPSULE | Freq: Two times a day (BID) | ORAL | Status: DC
  Administered 2016-08-29 – 2016-09-01 (×6): 100 mg via ORAL
  Filled 2016-08-29 (×6): qty 1

## 2016-08-29 MED ORDER — HYDROMORPHONE HCL 1 MG/ML IJ SOLN
1.0000 mg | Freq: Once | INTRAMUSCULAR | Status: AC
  Administered 2016-08-29: 1 mg via INTRAVENOUS

## 2016-08-29 MED ORDER — BUPIVACAINE-EPINEPHRINE (PF) 0.5% -1:200000 IJ SOLN
INTRAMUSCULAR | Status: DC | PRN
  Administered 2016-08-29: 30 mL via PERINEURAL

## 2016-08-29 MED ORDER — ACETAMINOPHEN 500 MG PO TABS
1000.0000 mg | ORAL_TABLET | Freq: Four times a day (QID) | ORAL | Status: AC
  Administered 2016-08-29 – 2016-08-30 (×4): 1000 mg via ORAL
  Filled 2016-08-29 (×5): qty 2

## 2016-08-29 MED ORDER — ACETAMINOPHEN 325 MG PO TABS
650.0000 mg | ORAL_TABLET | Freq: Four times a day (QID) | ORAL | Status: DC | PRN
  Administered 2016-08-30: 650 mg via ORAL
  Filled 2016-08-29: qty 2

## 2016-08-29 MED ORDER — PROPOFOL 500 MG/50ML IV EMUL
INTRAVENOUS | Status: AC
  Filled 2016-08-29: qty 50

## 2016-08-29 MED ORDER — PHENYLEPHRINE 40 MCG/ML (10ML) SYRINGE FOR IV PUSH (FOR BLOOD PRESSURE SUPPORT)
PREFILLED_SYRINGE | INTRAVENOUS | Status: AC
  Filled 2016-08-29: qty 10

## 2016-08-29 MED ORDER — FLEET ENEMA 7-19 GM/118ML RE ENEM: 1.0000 | ENEMA | Freq: Once | RECTAL | Status: DC | PRN

## 2016-08-29 MED ORDER — BISACODYL 10 MG RE SUPP: 10.0000 mg | Freq: Every day | RECTAL | Status: DC | PRN

## 2016-08-29 MED ORDER — LEVOTHYROXINE SODIUM 100 MCG PO TABS
50.0000 ug | ORAL_TABLET | Freq: Every day | ORAL | Status: DC
Start: 2016-08-30 — End: 2016-09-01
  Administered 2016-08-30 – 2016-09-01 (×3): 50 ug via ORAL
  Filled 2016-08-29 (×3): qty 1

## 2016-08-29 MED ORDER — DIPHENHYDRAMINE HCL 12.5 MG/5ML PO ELIX: 12.5000 mg | ORAL_SOLUTION | ORAL | Status: DC | PRN

## 2016-08-29 MED ORDER — LACTATED RINGERS IV SOLN
INTRAVENOUS | Status: DC
  Administered 2016-08-29: 12:00:00 via INTRAVENOUS

## 2016-08-29 MED ORDER — TOPIRAMATE 100 MG PO TABS
200.0000 mg | ORAL_TABLET | Freq: Every day | ORAL | Status: DC
  Administered 2016-08-30 – 2016-09-01 (×3): 200 mg via ORAL
  Filled 2016-08-29 (×3): qty 2

## 2016-08-29 MED ORDER — FLUVOXAMINE MALEATE 50 MG PO TABS
150.0000 mg | ORAL_TABLET | Freq: Two times a day (BID) | ORAL | Status: DC
  Administered 2016-08-29 – 2016-08-31 (×3): 150 mg via ORAL
  Filled 2016-08-29 (×5): qty 1

## 2016-08-29 MED ORDER — METRONIDAZOLE 0.75 % EX CREA
1.0000 "application " | TOPICAL_CREAM | Freq: Two times a day (BID) | CUTANEOUS | Status: DC
  Administered 2016-08-29 – 2016-08-31 (×4): 1 via TOPICAL
  Filled 2016-08-29: qty 45

## 2016-08-29 MED ORDER — METOCLOPRAMIDE HCL 10 MG PO TABS: 5.0000 mg | ORAL_TABLET | Freq: Three times a day (TID) | ORAL | Status: DC | PRN

## 2016-08-29 MED ORDER — EPINEPHRINE PF 1 MG/ML IJ SOLN
INTRAMUSCULAR | Status: AC
  Filled 2016-08-29: qty 1

## 2016-08-29 MED ORDER — ACETAMINOPHEN 650 MG RE SUPP: 650.0000 mg | Freq: Four times a day (QID) | RECTAL | Status: DC | PRN

## 2016-08-29 MED ORDER — AMOXICILLIN-POT CLAVULANATE 875-125 MG PO TABS
1.0000 | ORAL_TABLET | Freq: Two times a day (BID) | ORAL | Status: DC
  Administered 2016-08-29 – 2016-09-01 (×6): 1 via ORAL
  Filled 2016-08-29 (×6): qty 1

## 2016-08-29 MED ORDER — HYDROMORPHONE HCL 1 MG/ML IJ SOLN
1.0000 mg | INTRAMUSCULAR | Status: DC | PRN
  Administered 2016-08-29 (×2): 1 mg via INTRAVENOUS
  Filled 2016-08-29 (×2): qty 1

## 2016-08-29 MED ORDER — ONDANSETRON HCL 4 MG/2ML IJ SOLN: 4.0000 mg | Freq: Four times a day (QID) | INTRAMUSCULAR | Status: DC | PRN

## 2016-08-29 MED ORDER — CLINDAMYCIN PHOSPHATE 900 MG/50ML IV SOLN
900.0000 mg | Freq: Once | INTRAVENOUS | Status: AC
  Administered 2016-08-29: 900 mg via INTRAVENOUS

## 2016-08-29 MED ORDER — MAGNESIUM HYDROXIDE 400 MG/5ML PO SUSP: 30.0000 mL | Freq: Every day | ORAL | Status: DC | PRN

## 2016-08-29 MED ORDER — SODIUM CHLORIDE 0.9 % IV BOLUS (SEPSIS)
1000.0000 mL | Freq: Once | INTRAVENOUS | Status: AC
  Administered 2016-08-29: 1000 mL via INTRAVENOUS

## 2016-08-29 MED ORDER — BUPIVACAINE-EPINEPHRINE (PF) 0.25% -1:200000 IJ SOLN
INTRAMUSCULAR | Status: AC
  Filled 2016-08-29: qty 30

## 2016-08-29 MED ORDER — FENTANYL CITRATE (PF) 100 MCG/2ML IJ SOLN: 25.0000 ug | INTRAMUSCULAR | Status: DC | PRN

## 2016-08-29 SURGICAL SUPPLY — 42 items
BIT DRILL 4.3MMS DISTAL GRDTED (BIT) ×1 IMPLANT
BNDG COHESIVE 4X5 TAN STRL (GAUZE/BANDAGES/DRESSINGS) ×4 IMPLANT
BNDG COHESIVE 6X5 TAN STRL LF (GAUZE/BANDAGES/DRESSINGS) ×2 IMPLANT
CANISTER SUCT 1200ML W/VALVE (MISCELLANEOUS) ×2 IMPLANT
CHLORAPREP W/TINT 26ML (MISCELLANEOUS) ×2 IMPLANT
CORTICAL BONE SCR 5.0MM X 48MM (Screw) ×2 IMPLANT
DRAPE C-ARMOR (DRAPES) ×2 IMPLANT
DRAPE SHEET LG 3/4 BI-LAMINATE (DRAPES) ×2 IMPLANT
DRILL 4.3MMS DISTAL GRADUATED (BIT) ×2
DRSG OPSITE POSTOP 3X4 (GAUZE/BANDAGES/DRESSINGS) ×4 IMPLANT
DRSG OPSITE POSTOP 4X6 (GAUZE/BANDAGES/DRESSINGS) ×2 IMPLANT
ELECT CAUTERY BLADE 6.4 (BLADE) ×2 IMPLANT
ELECT REM PT RETURN 9FT ADLT (ELECTROSURGICAL) ×2
ELECTRODE REM PT RTRN 9FT ADLT (ELECTROSURGICAL) ×1 IMPLANT
GAUZE SPONGE 4X4 12PLY STRL (GAUZE/BANDAGES/DRESSINGS) ×2 IMPLANT
GLOVE BIO SURGEON STRL SZ8 (GLOVE) ×8 IMPLANT
GLOVE INDICATOR 8.0 STRL GRN (GLOVE) ×2 IMPLANT
GOWN STRL REUS W/ TWL LRG LVL3 (GOWN DISPOSABLE) ×1 IMPLANT
GOWN STRL REUS W/ TWL XL LVL3 (GOWN DISPOSABLE) ×1 IMPLANT
GOWN STRL REUS W/TWL LRG LVL3 (GOWN DISPOSABLE) ×1
GOWN STRL REUS W/TWL XL LVL3 (GOWN DISPOSABLE) ×1
GUIDEPIN VERSANAIL DSP 3.2X444 ×2 IMPLANT
GUIDEWIRE BALL NOSE 100CM (WIRE) ×2 IMPLANT
HFN LH 130 DEG 11MM X 380MM (Orthopedic Implant) ×2 IMPLANT
HIP FRAC NAIL LAG SCR 10.5X100 (Orthopedic Implant) ×1 IMPLANT
MAT BLUE FLOOR 46X72 FLO (MISCELLANEOUS) ×2 IMPLANT
NEEDLE FILTER BLUNT 18X 1/2SAF (NEEDLE) ×1
NEEDLE FILTER BLUNT 18X1 1/2 (NEEDLE) ×1 IMPLANT
NEEDLE HYPO 22GX1.5 SAFETY (NEEDLE) ×2 IMPLANT
NS IRRIG 500ML POUR BTL (IV SOLUTION) ×2 IMPLANT
PACK HIP COMPR (MISCELLANEOUS) ×2 IMPLANT
SCREW BONE CORTICAL 5.0X44 (Screw) ×2 IMPLANT
SCREW CANN THRD AFF 10.5X100 (Orthopedic Implant) ×1 IMPLANT
SCREW CORTICL BON 5.0MM X 48MM (Screw) ×1 IMPLANT
SCREWDRIVER HEX TIP 3.5MM (MISCELLANEOUS) ×4 IMPLANT
STAPLER SKIN PROX 35W (STAPLE) ×2 IMPLANT
STRAP SAFETY BODY (MISCELLANEOUS) ×2 IMPLANT
SUT VIC AB 1 CT1 36 (SUTURE) ×2 IMPLANT
SUT VIC AB 2-0 CT1 (SUTURE) ×4 IMPLANT
SYR 30ML LL (SYRINGE) ×2 IMPLANT
SYRINGE 10CC LL (SYRINGE) ×2 IMPLANT
TAPE MICROFOAM 4IN (TAPE) ×2 IMPLANT

## 2016-08-29 NOTE — Progress Notes (Addendum)
Pt admittted to 1-A Room 147. Family at bedside.

## 2016-08-29 NOTE — ED Notes (Signed)
Pt taken to the OR at this time by Josh, OR orderly.

## 2016-08-29 NOTE — ED Triage Notes (Signed)
Pt arrived via EMS from home. Pt out walking her dog when the dog made her fall onto her left side. Pt has obvious deformity to left hip. EMS found pt lying on right side.  Pt given 100 mcg of Fentanyl by EMS.  Has hx of leg injury to left side with surgery.

## 2016-08-29 NOTE — Op Note (Signed)
08/29/2016  2:08 PM  Patient:   Diane Villegas  Pre-Op Diagnosis:   Closed displaced comminuted subtrochanteric fracture, left femur.  Post-Op Diagnosis:   Same.  Procedure:   Reduction and internal fixation of left subtrochanteric femur fracture with Biomet Affixis TFN nail.  Surgeon:   Maryagnes Amos, MD  Assistant:   None  Anesthesia:   Spinal with General LMA  Findings:   As above  Complications:   None  EBL:   150 cc  Fluids:   1500 cc crystalloid  UOP:   None  TT:   None  Drains:   None  Closure:   Staples  Implants:   Biomet Affixis 11 x 380 mm TFN with a 100 mm lag screw, and 44 mm and 48 mm distal interlocking screws  Brief Clinical Note:   The patient is a 46 year old female who sustained the above-noted injury earlier this morning when she was pulled suddenly while walking her sister's dog and fell awkwardly onto the ground. She was brought to the emergency room where x-rays demonstrated the above-noted injury. The patient has been cleared medically and presents at this time for reduction and internal fixation of the displaced subtrochanteric left femur fracture.  Procedure:   The patient was brought into the operating room. After adequate spinal anesthesia was obtained, the patient was lain in the supine position on the fracture table. The patient continued to squirm and appeared quite anxious, so the anesthesiologist elected to proceed with general laryngeal mask anesthesia in addition to the spinal. Once adequate anesthesia was obtained, the uninjured leg was placed in a flexed and abducted position while the injured lower extremity was placed in longitudinal traction. The fracture was reduced using longitudinal traction and internal rotation. The adequacy of reduction was verified fluoroscopically in AP and lateral projections and found to be near anatomic. The lateral aspects of the left hip and thigh were prepped with ChloraPrep solution before being draped  sterilely. Preoperative antibiotics were administered. A timeout was performed to verify the appropriate surgical site. The greater trochanter was identified fluoroscopically and an approximately 3 cm incision made about 2-3 fingerbreadths above the tip of the greater trochanter. The incision was carried down through the subcutaneous tissues to expose the gluteal fascia. This was split the length of the incision, providing access to the tip of the trochanter. Under fluoroscopic guidance, a guidewire was drilled through the tip of the trochanter into the proximal metaphysis to the level of the lesser trochanter. After verifying its position fluoroscopically in AP and lateral projections, it was overreamed with the initial reamer to the depth of the lesser trochanter. A guidewire was passed down through the femoral canal to the supracondylar region. The adequacy of guidewire position was verified fluoroscopically in AP and lateral projections before the length of the guidewire within the canal was measured and found to be 415 mm. Therefore, a 380 mm length nail was selected. The guidewire was overreamed sequentially using the flexible reamers, beginning with a 9.5 mm reamer and progressing to a 12.5 mm reamer. This provided good cortical chatter. The 11 x 380 mm Biomet Affixis TFN rod was selected and advanced to the appropriate depth, as verified fluoroscopically. The guide system for the lag screw was positioned and advanced through an approximately 2 cm stab incision over the lateral aspect of the proximal femur. The guidewire was drilled up through the trochanteric femoral nail and into the femoral neck to rest within 5 mm of subchondral bone.  After verifying its position in the femoral neck and head in both AP and lateral projections, the guidewire was measured and found to be optimally replicated by a 100 mm lag screw. The guidewire was overreamed to the appropriate depth before the lag screw was inserted and  advanced to the appropriate depth as verified fluoroscopically in AP and lateral projections. The locking screw was advanced, then backed off a quarter turn to set the lag screw. Again the adequacy of hardware position and fracture reduction was verified fluoroscopically in AP and lateral projections and found to be excellent.  Attention was directed distally. Using the "perfect circle" technique, the leg and fluoroscopy machine were positioned appropriately. An approximate 1.5 cm stab incision was made over the skin at the appropriate point before the drill bit was advanced through the cortex and across the static hole of the nail. The appropriate length of the screw was determined before the 44 mm distal interlocking screw was positioned, then advanced and tightened securely. Given that this was an unstable subtrochanteric fracture, it was felt best to proceed with a second distal interlocking screw. Therefore, a second 1.5 mm stab incision was made at the appropriate position overlying the proximal end of the dynamic screw hole. The drill bit was advanced through the cortex and across the dynamic hole of the nail to exit the medial cortex. The screw length again was assessed. A 48 mm distal interlocking screw was selected and advanced before being tightened securely. Again the adequacy of screw position was verified fluoroscopically in AP and lateral projections and found to be excellent.  The wounds were irrigated thoroughly with sterile saline solution before the deeper subcutaneous tissues were closed using #1 Vicryl interrupted sutures. The more superficial subcutaneous tissues were closed using 2-0 Vicryl interrupted sutures before the skin was closed using staples. A total of 30 cc of 0.5% Sensorcaine with epinephrine was injected in and around all incisions. Sterile occlusive dressings were applied to all wounds before the patient was transferred back to her hospital bed. The patient was then awakened,  extubated, and transferred to the recovery room in satisfactory condition after tolerating the procedure well.

## 2016-08-29 NOTE — Transfer of Care (Signed)
Immediate Anesthesia Transfer of Care Note  Patient: Diane Villegas  Procedure(s) Performed: Procedure(s): INTRAMEDULLARY (IM) NAIL INTERTROCHANTRIC (Left)  Patient Location: PACU  Anesthesia Type:General and Spinal  Level of Consciousness: awake, alert  and oriented  Airway & Oxygen Therapy: Patient Spontanous Breathing and Patient connected to face mask oxygen  Post-op Assessment: Report given to RN  Post vital signs: Reviewed and stable  Last Vitals:  Vitals:   08/29/16 1100 08/29/16 1410  BP: (!) 90/59 (!) 140/98  Pulse: 79 97  Resp:  20  Temp:  (!) 36 C    Last Pain:  Vitals:   08/29/16 0833  TempSrc:   PainSc: 10-Worst pain ever         Complications: No apparent anesthesia complications

## 2016-08-29 NOTE — Progress Notes (Addendum)
Spoke with Dr. Joice LoftsPoggi, need parameters for Dilaudid as pt's BP is low. Pt states she typically runs low. Per Dr. Joice LoftsPoggi, systolic over 80 is ok as long as pt is not symptomatic. Requests foley to be placed.

## 2016-08-29 NOTE — ED Notes (Addendum)
This RN to bedside at this time. Pt noted to be moaning in pain, states pain 10/10 at this time. Pt repeatedly states "I'm just in so much pain". This RN obtained consent form, having patient's sister sign per patient request due to pain and patient received IV pain medication.

## 2016-08-29 NOTE — ED Provider Notes (Signed)
[facility] Emergency Department Provider Note   ____________________________________________    I have reviewed the triage vital signs and the nursing notes.   HISTORY  Chief Complaint Hip Injury     HPI Diane Villegas is a 46 y.o. female who presents with complaints of severe sharp left hip/left leg pain. Patient reports she was walking her dog who pulled suddenly and fell and felt like her leg went in two directions at once. She was unable to get up. EMS gave 100 g of fentanyl en route. She continues to complain of severe pain. No other injuries reported. No abdominal pain. No chest pain.   Past Medical History:  Diagnosis Date  . History of hypothyroidism    Off meds  . History of meningitis   . IBS (irritable bowel syndrome)   . MDD (major depressive disorder)   . Migraine    Dr. Catalina Lunger at the [facility]  . OCD (obsessive compulsive disorder)   . Osteomyelitis [facility])     Patient Active Problem List   Diagnosis Date Noted  . Calculus of bile duct 04/23/2015  . Gastro-esophageal reflux disease without esophagitis 09/04/2014  . MDD (major depressive disorder)   . MDD (major depressive disorder), recurrent episode, moderate (HCC) 03/14/2014  . Inflammatory disorder of extremity 11/02/2013  . LUMBAR RADICULOPATHY, RIGHT 04/08/2010  . SCIATICA 03/21/2010  . CIGARETTE SMOKER 06/27/2009  . BACK PAIN, THORACIC REGION 04/01/2009  . HYPOTHYROIDISM 11/19/2008  . PREMATURE VENTRICULAR CONTRACTIONS 11/19/2008  . OCD (obsessive compulsive disorder) 06/19/2008  . MIGRAINE HEADACHE 06/19/2008    Past Surgical History:  Procedure Laterality Date  . BTL  2004  . CHOLECYSTECTOMY    . PARTIAL HYSTERECTOMY  2007   For prolapse  . THYROIDECTOMY, PARTIAL  1999  . TONSILLECTOMY      Prior to Admission medications   Medication Sig Start Date End Date Taking? Authorizing Provider  amoxicillin-clavulanate (AUGMENTIN) 875-125 MG tablet Take 1  tablet by mouth 2 (two) times daily. 08/26/16   Faythe Ghee, PA-C  busPIRone (BUSPAR) 10 MG tablet Take 1 tablet (10 mg total) by mouth 2 (two) times daily. Patient taking differently: Take 15 mg by mouth 2 (two) times daily.  10/31/15   Thresa Ross, MD  divalproex (DEPAKOTE) 250 MG DR tablet TAKE 1 TABLET EVERY MORNING AND TAKE 2 TABLETS AT BEDTIME 11/27/15   Historical Provider, MD  fluconazole (DIFLUCAN) 150 MG tablet Take one now and one in a week 08/26/16   Faythe Ghee, PA-C  fluvoxaMINE (LUVOX) 100 MG tablet Take 1 tablet (100 mg total) by mouth 2 (two) times daily. 10/31/15   Thresa Ross, MD  fluvoxaMINE (LUVOX) 50 MG tablet Take 1 tablet (50 mg total) by mouth 2 (two) times daily. 10/31/15 10/30/16  Thresa Ross, MD  Levothyroxine Sodium 50 MCG CAPS Take 1 capsule (50 mcg total) by mouth daily. Take first thing in the AM 30 min before breakfast. 03/22/12   Jade L Breeback, PA-C  METRONIDAZOLE, TOPICAL, 0.75 % LOTN Apply 1 application topically 2 (two) times daily. 11/21/13   Historical Provider, MD  topiramate (TOPAMAX) 200 MG tablet Take 1 tablet (200 mg total) by mouth daily. 10/31/15   Thresa Ross, MD  traZODone (DESYREL) 50 MG tablet Take 1 tablet (50 mg total) by mouth at bedtime. Patient not taking: Reported on 08/26/2016 10/31/15   Thresa Ross, MD     Allergies Oxycodone; Cefazolin; Codeine; Moxifloxacin; and Tramadol hcl  Family History  Problem Relation Age of Onset  . Lymphoma Father   . Dementia Father   . Drug abuse Father   . Alcohol abuse Father   . Diabetes Mother   . Colon cancer Mother   . Arrhythmia Mother     Palpitations  . Depression Mother   . Anxiety disorder Mother   . Arrhythmia Sister     Unknown type  . Depression Sister   . Anxiety disorder Sister   . Colon cancer Maternal Grandmother 60  . Alcohol abuse Brother   . Bipolar disorder Maternal Aunt   . Paranoid behavior Maternal Aunt   . Drug abuse Paternal Uncle   . Alcohol abuse Paternal Uncle   .  Drug abuse Maternal Grandfather   . Alcohol abuse Maternal Grandfather   . Dementia Paternal Grandfather   . Dementia Paternal Grandmother     Social History Social History  Substance Use Topics  . Smoking status: Current Every Day Smoker    Packs/day: 0.50    Years: 11.00    Types: Cigarettes  . Smokeless tobacco: Never Used  . Alcohol use 0.5 oz/week    1 Standard drinks or equivalent per week    Review of Systems  Constitutional: No Dizziness Eyes: No visual changes.  ENT: No Neck pain Cardiovascular: Denies chest pain. Respiratory: Denies shortness of breath. Gastrointestinal: No abdominal pain.  Musculoskeletal: Negative for back pain. Left leg pain as above Skin: Negative for rash. Neurological: Negative for headaches , reports left leg "doesn't feel like her "  10-point ROS otherwise negative.  ____________________________________________   PHYSICAL EXAM:  VITAL SIGNS: ED Triage Vitals  Enc Vitals Group     BP 08/29/16 0821 (!) 114/51     Pulse Rate 08/29/16 0821 88     Resp 08/29/16 0821 (!) 22     Temp 08/29/16 0821 97.5 F (36.4 C)     Temp Source 08/29/16 0821 Oral     SpO2 08/29/16 0821 98 %     Weight 08/29/16 0817 145 lb 8 oz (66 kg)     Height 08/29/16 0817 5\' 10"  (1.778 m)     Head Circumference --      Peak Flow --      Pain Score 08/29/16 0818 10     Pain Loc --      Pain Edu? --      Excl. in GC? --     Constitutional: Alert and oriented.  In severe pain Eyes: Conjunctivae are normal.  Head: Atraumatic. Nose: No congestion/rhinnorhea. Mouth/Throat: Mucous membranes are moist.   Neck:  Painless ROM , no vertebral tenderness to palpation Cardiovascular: Normal rate, regular rhythm.  Good peripheral circulation. Respiratory: Normal respiratory effort.  No retractions.  Gastrointestinal: Soft and nontender. No distention.  No CVA tenderness. Genitourinary: deferred Musculoskeletal: Left leg with apparent proximal femur deformity.  Quite tender to palpation. She is unable to range the left leg. Warm and well perfused distally Neurologic:  Normal speech and language. No gross focal neurologic deficits are appreciated.  Skin:  Skin is warm, dry. Minor abrasion left buttock Psychiatric: Mood and affect are normal. Speech and behavior are normal.  ____________________________________________   LABS (all labs ordered are listed, but only abnormal results are displayed)  Labs Reviewed  BASIC METABOLIC PANEL  CBC WITH DIFFERENTIAL/PLATELET  PROTIME-INR  TYPE AND SCREEN   ____________________________________________  EKG  None ____________________________________________  RADIOLOGY  X-ray demonstrates proximal left femur fracture ____________________________________________   PROCEDURES  Procedure(s) performed: No  Critical Care performed: No ____________________________________________   INITIAL IMPRESSION / ASSESSMENT AND PLAN / ED COURSE  Pertinent labs & imaging results that were available during my care of the patient were reviewed by me and considered in my medical decision making (see chart for details).  Patient presents after a fall, suspect femur fracture. She is still in significant pain after fentanyl so 1 mg of Dilaudid given. X-rays pending  Clinical Course   X-ray demonstrates proximal left femur fracture, discussed with Dr. Joice LoftsPoggi. He recommends keeping the patient nothing by mouth and he will admit for surgery. ____________________________________________   FINAL CLINICAL IMPRESSION(S) / ED DIAGNOSES  Final diagnoses:  Closed fracture of proximal end of left femur, initial encounter (HCC)      NEW MEDICATIONS STARTED DURING THIS VISIT:  New Prescriptions   No medications on file     Note:  This document was prepared using Dragon voice recognition software and may include unintentional dictation errors.    Jene Everyobert Maurico Perrell, MD 08/29/16 [phone]

## 2016-08-29 NOTE — H&P (Signed)
Subjective:  Chief complaint:  Left hip/thigh pain.  The patient is a 46 y.o. female who sustained an injury to the left hip/thigh earlier this morning. Apparently she was out walking her brother's dog, a large St. Bernard, when the dog was directed to something and started off suddenly, jerking her suddenly. She felt to the ground and felt her leg twisted awkwardly. She was unable to get up from the ground. The EMS was called and she was brought to the emergency room where x-rays demonstrated a displaced comminuted subtrochanteric left femur fracture. The patient denies any associated injury or loss of consciousness associated with the injury, and denies any light-headedness, loss of consciousness, chest pain, or shortness of breath which might have contributed to the injury. The patient denies any prior injury to her left hip and thigh, but does recall having a left tib-fib fracture several years ago which was treated with initial external fixation followed by internal fixation. She went on to heal this well.  Patient Active Problem List   Diagnosis Date Noted  . Calculus of bile duct 04/23/2015  . Gastro-esophageal reflux disease without esophagitis 09/04/2014  . MDD (major depressive disorder)   . MDD (major depressive disorder), recurrent episode, moderate (HCC) 03/14/2014  . Inflammatory disorder of extremity 11/02/2013  . LUMBAR RADICULOPATHY, RIGHT 04/08/2010  . SCIATICA 03/21/2010  . CIGARETTE SMOKER 06/27/2009  . BACK PAIN, THORACIC REGION 04/01/2009  . HYPOTHYROIDISM 11/19/2008  . PREMATURE VENTRICULAR CONTRACTIONS 11/19/2008  . OCD (obsessive compulsive disorder) 06/19/2008  . MIGRAINE HEADACHE 06/19/2008   Past Medical History:  Diagnosis Date  . History of hypothyroidism    Off meds  . History of meningitis   . IBS (irritable bowel syndrome)   . MDD (major depressive disorder)   . Migraine    [facility]  . OCD (obsessive compulsive disorder)   .  Osteomyelitis [facility](HCC)     Past Surgical History:  Procedure Laterality Date  . BTL  2004  . CHOLECYSTECTOMY    . PARTIAL HYSTERECTOMY  2007   For prolapse  . THYROIDECTOMY, PARTIAL  1999  . TONSILLECTOMY       (Not in a hospital admission) Allergies  Allergen Reactions  . Oxycodone Itching  . Cefazolin Hives  . Codeine Nausea Only  . Moxifloxacin   . Tramadol Hcl     REACTION: vomiting    Social History  Substance Use Topics  . Smoking status: Current Every Day Smoker    Packs/day: 0.50    Years: 11.00    Types: Cigarettes  . Smokeless tobacco: Never Used  . Alcohol use 0.5 oz/week    1 Standard drinks or equivalent per week    Family History  Problem Relation Age of Onset  . Lymphoma Father   . Dementia Father   . Drug abuse Father   . Alcohol abuse Father   . Diabetes Mother   . Colon cancer Mother   . Arrhythmia Mother     Palpitations  . Depression Mother   . Anxiety disorder Mother   . Arrhythmia Sister     Unknown type  . Depression Sister   . Anxiety disorder Sister   . Colon cancer Maternal Grandmother 60  . Alcohol abuse Brother   . Bipolar disorder Maternal Aunt   . Paranoid behavior Maternal Aunt   . Drug abuse Paternal Uncle   . Alcohol abuse Paternal Uncle   . Drug abuse Maternal Grandfather   . Alcohol abuse  Maternal Grandfather   . Dementia Paternal Grandfather   . Dementia Paternal Grandmother      Review of Systems: As noted above. The patient denies any chest pain, shortness of breath, nausea, vomiting, diarrhea, constipation, belly pain, blood in his/her stool, or burning with urination.  Objective: Temp:  [97.5 F (36.4 C)] 97.5 F (36.4 C) (01/06 0821) Pulse Rate:  [71-88] 82 (01/06 1022) Resp:  [22] 22 (01/06 0821) BP: (93-114)/(49-51) 93/51 (01/06 1022) SpO2:  [96 %-99 %] 99 % (01/06 1022) Weight:  [66 kg (145 lb 8 oz)] 66 kg (145 lb 8 oz) (01/06 0817)  Physical Exam: General:  Alert, moderate acute distress Psychiatric:   Patient is competent for consent with normal mood and affect  Cardiovascular:  RRR  Respiratory:  Clear to auscultation. No wheezing. Non-labored breathing GI:  Abdomen is soft and non-tender Skin:  No lesions in the area of chief complaint Neurologic:  Sensation intact distally Lymphatic:  No axillary or cervical lymphadenopathy  Orthopedic Exam:  Orthopedic examination is limited to the left hip and lower extremity. Skin inspection around the left hip is unremarkable. There is no evidence for erythema, ecchymosis, abrasions, or other abnormalities. There does appear to be some fullness secondary to swelling versus deformity of the proximal lateral thigh region. She has moderate tenderness to palpation in this area. She has severe pain with any attempted active or passive motion of her hip or lower extremity. She is able to dorsiflex and plantarflex her toes and ankle, and has intact sensation to light touch to all distributions of her left lower extremity. She has good capillary refill to her left foot.  Imaging Review: Recent x-rays of the pelvis and left femur are available for review.  These films demonstrate a displaced comminuted subtrochanteric fracture of her left femur with a large butterfly fragment. The hip joint itself appears to be intact with a well maintained clear space and no significant degenerative changes. No lytic lesions are identified.  Assessment: Closed displaced comminuted left subtrochanteric femur fracture.  Plan: The treatment options been discussed in detail with the patient to include reduction and intramedullary nail fixation of this fracture. The risks (including bleeding, infection, nerve and/or blood vessel injury, persistent or recurrent pain, malunion and/or nonunion, loosening or failure of the components, leg length inequality, need for further surgery, blood clots, strokes, heart attacks or arrhythmias, pneumonia, etc.) and benefits of this procedure also  were discussed. The patient states her understanding and agrees to proceed. She agrees to a blood transfusion if necessary. A formal written consent will be obtained by the nursing staff.

## 2016-08-29 NOTE — ED Notes (Signed)
2 yellow rings, 1 with blue/gray stone, 1 with white stone, 2 gray earrings, 1 brown cloth bracelet, 1 brown twine bracelet place in specimen cup and given to patient's sister. Pt's sister also took all of patient's clothes in a personal belongings bag at this time.

## 2016-08-29 NOTE — Anesthesia Preprocedure Evaluation (Addendum)
Anesthesia Evaluation  Patient identified by MRN, date of birth, ID band Patient awake    Reviewed: Allergy & Precautions, NPO status , Patient's Chart, lab work & pertinent test results, reviewed documented beta blocker date and time   Airway Mallampati: II  TM Distance: >3 FB     Dental  (+) Chipped   Pulmonary Current Smoker,           Cardiovascular      Neuro/Psych  Headaches, PSYCHIATRIC DISORDERS Depression  Neuromuscular disease    GI/Hepatic GERD  Controlled,  Endo/Other  Hypothyroidism   Renal/GU      Musculoskeletal   Abdominal   Peds  Hematology   Anesthesia Other Findings IBS. Hx of PVCs. CXR ok. No cardiac issues at present.  Reproductive/Obstetrics                            Anesthesia Physical Anesthesia Plan  ASA: III  Anesthesia Plan: General   Post-op Pain Management:    Induction: Intravenous  Airway Management Planned: Oral ETT  Additional Equipment:   Intra-op Plan:   Post-operative Plan:   Informed Consent: I have reviewed the patients History and Physical, chart, labs and discussed the procedure including the risks, benefits and alternatives for the proposed anesthesia with the patient or authorized representative who has indicated his/her understanding and acceptance.     Plan Discussed with: CRNA  Anesthesia Plan Comments:         Anesthesia Quick Evaluation

## 2016-08-30 LAB — BASIC METABOLIC PANEL
Anion gap: 3 — ABNORMAL LOW (ref 5–15)
BUN: 11 mg/dL (ref 6–20)
CALCIUM: 7.8 mg/dL — AB (ref 8.9–10.3)
CHLORIDE: 115 mmol/L — AB (ref 101–111)
CO2: 20 mmol/L — AB (ref 22–32)
Creatinine, Ser: 0.75 mg/dL (ref 0.44–1.00)
GFR calc Af Amer: >60 mL/min (ref >60)
GFR calc non Af Amer: >60 mL/min (ref >60)
GLUCOSE: 120 mg/dL — AB (ref 65–99)
Potassium: 3.5 mmol/L (ref 3.5–5.1)
Sodium: 138 mmol/L (ref 135–145)

## 2016-08-30 LAB — CBC WITH DIFFERENTIAL/PLATELET
BASOS PCT: 1 %
Basophils Absolute: 0 10*3/uL (ref 0–0.1)
EOS ABS: 0.3 10*3/uL (ref 0–0.7)
Eosinophils Relative: 7 %
HEMATOCRIT: 26.6 % — AB (ref 35.0–47.0)
Hemoglobin: 9 g/dL — ABNORMAL LOW (ref 12.0–16.0)
Lymphocytes Relative: 38 %
Lymphs Abs: 1.6 10*3/uL (ref 1.0–3.6)
MCH: 31.5 pg (ref 26.0–34.0)
MCHC: 33.7 g/dL (ref 32.0–36.0)
MCV: 93.3 fL (ref 80.0–100.0)
MONO ABS: 0.4 10*3/uL (ref 0.2–0.9)
MONOS PCT: 10 %
Neutro Abs: 1.9 10*3/uL (ref 1.4–6.5)
Neutrophils Relative %: 44 %
Platelets: 203 10*3/uL (ref 150–440)
RBC: 2.85 MIL/uL — ABNORMAL LOW (ref 3.80–5.20)
RDW: 12.3 % (ref 11.5–14.5)
WBC: 4.2 10*3/uL (ref 3.6–11.0)

## 2016-08-30 MED ORDER — HYDROMORPHONE HCL 2 MG PO TABS
2.0000 mg | ORAL_TABLET | ORAL | Status: DC | PRN
  Administered 2016-08-30 (×2): 2 mg via ORAL
  Administered 2016-08-31 (×3): 4 mg via ORAL
  Administered 2016-08-31: 2 mg via ORAL
  Administered 2016-08-31 – 2016-09-01 (×4): 4 mg via ORAL
  Filled 2016-08-30: qty 1
  Filled 2016-08-30 (×8): qty 2
  Filled 2016-08-30 (×2): qty 1

## 2016-08-30 MED ORDER — INFLUENZA VAC SPLIT QUAD 0.5 ML IM SUSY: 0.5000 mL | PREFILLED_SYRINGE | INTRAMUSCULAR | Status: DC

## 2016-08-30 MED ORDER — NICOTINE 14 MG/24HR TD PT24
14.0000 mg | MEDICATED_PATCH | Freq: Every day | TRANSDERMAL | Status: DC
  Administered 2016-08-30 – 2016-09-01 (×3): 14 mg via TRANSDERMAL
  Filled 2016-08-30 (×3): qty 1

## 2016-08-30 MED ORDER — BUPIVACAINE HCL (PF) 0.75 % IJ SOLN
INTRAMUSCULAR | Status: DC | PRN
  Administered 2016-08-29: 1.6 mL

## 2016-08-30 MED ORDER — MAGIC MOUTHWASH
10.0000 mL | Freq: Four times a day (QID) | ORAL | Status: DC | PRN
  Administered 2016-08-30 – 2016-09-01 (×4): 10 mL via ORAL
  Filled 2016-08-30 (×6): qty 10

## 2016-08-30 MED ORDER — KETOROLAC TROMETHAMINE 15 MG/ML IJ SOLN
15.0000 mg | Freq: Four times a day (QID) | INTRAMUSCULAR | Status: AC
  Administered 2016-08-30 – 2016-08-31 (×4): 15 mg via INTRAVENOUS
  Filled 2016-08-30 (×3): qty 1

## 2016-08-30 NOTE — Anesthesia Procedure Notes (Signed)
Spinal  Patient location during procedure: OR Staffing Anesthesiologist: Berdine AddisonHOMAS, Diane Villegas Performed: anesthesiologist  Preanesthetic Checklist Completed: patient identified, site marked, surgical consent, pre-op evaluation, timeout performed, IV checked and risks and benefits discussed Spinal Block Patient position: sitting Prep: Betadine Patient monitoring: heart rate, cardiac monitor, continuous pulse ox and blood pressure Approach: midline Location: L3-4 Injection technique: single-shot Needle Needle type: Pencil-Tip  Needle gauge: 25 G Needle length: 9 cm Assessment Sensory level: T10 Additional Notes 1216 marcaine 1.706ml.

## 2016-08-30 NOTE — Progress Notes (Signed)
Pt complains of soreness in mouth, per pt she has had this before related to abx. Requests mouthwash.

## 2016-08-30 NOTE — Progress Notes (Signed)
  Subjective: 1 Day Post-Op Procedure(s) (LRB): INTRAMEDULLARY (IM) NAIL INTERTROCHANTRIC (Left) Patient reports pain as moderate.   Patient seen in rounds with Dr. Joice LoftsPoggi. Patient is well, and has had no acute complaints or problems Plan is to go Home versus rehabilitation after hospital stay. Negative for chest pain and shortness of breath Fever: no Gastrointestinal: Negative for nausea and vomiting  Objective: Vital signs in last 24 hours: Temp:  [97.3 F (36.3 C)-98.8 F (37.1 C)] 98.8 F (37.1 C) (01/07 0413) Pulse Rate:  [71-99] 76 (01/07 0413) Resp:  [16-22] 18 (01/07 0413) BP: (85-140)/(49-98) 90/57 (01/07 0413) SpO2:  [96 %-100 %] 100 % (01/07 0413) Weight:  [66 kg (145 lb 8 oz)] 66 kg (145 lb 8 oz) (01/06 0817)  Intake/Output from previous day:  Intake/Output Summary (Last 24 hours) at 08/30/16 0627 Last data filed at 08/30/16 16100613  Gross per 24 hour  Intake          2773.33 ml  Output             1300 ml  Net          1473.33 ml    Intake/Output this shift: Total I/O In: 1273.3 [I.V.:1123.3; IV Piggyback:150] Out: 1150 [Urine:1150]  Labs:  Recent Labs  08/29/16 0828 08/30/16 0430  HGB 13.6 9.0*    Recent Labs  08/29/16 0828 08/30/16 0430  WBC 7.2 4.2  RBC 4.30 2.85*  HCT 39.7 26.6*  PLT 311 203    Recent Labs  08/29/16 0828 08/30/16 0430  NA 139 138  K 4.1 3.5  CL 113* 115*  CO2 21* 20*  BUN 13 11  CREATININE 0.93 0.75  GLUCOSE 110* 120*  CALCIUM 8.6* 7.8*    Recent Labs  08/29/16 0828  INR 0.92     EXAM General - Patient is Alert and Oriented Extremity - Sensation intact distally Dorsiflexion/Plantar flexion intact No cellulitis present Compartment soft Dressing/Incision - clean, dry, blood tinged drainage Motor Function - intact, moving foot and toes well on exam.   Past Medical History:  Diagnosis Date  . History of hypothyroidism    Off meds  . History of meningitis   . IBS (irritable bowel syndrome)   . MDD  (major depressive disorder)   . Migraine    [facility]  . OCD (obsessive compulsive disorder)   . Osteomyelitis (HCC)     Assessment/Plan: 1 Day Post-Op Procedure(s) (LRB): INTRAMEDULLARY (IM) NAIL INTERTROCHANTRIC (Left) Active Problems:   Closed left subtrochanteric femur fracture (HCC)  Estimated body mass index is 20.88 kg/m as calculated from the following:   Height as of this encounter: 5\' 10"  (1.778 m).   Weight as of this encounter: 66 kg (145 lb 8 oz). Advance diet Up with therapy D/C IV fluids  Hemoglobin 9.0. Acute blood loss anemia secondary to fractured femur and surgery Bowel management.  DVT Prophylaxis - Lovenox, Foot Pumps and TED hose Touchdown Weight-Bearing to left leg  Dedra Skeensodd Jerrie Schussler, PA-C Orthopaedic Surgery 08/30/2016, 6:27 AM

## 2016-08-30 NOTE — Evaluation (Signed)
Physical Therapy Evaluation Patient Details Name: Diane Villegas MRN: [id number] DOB: 1971-03-21 Today's Date: 08/30/2016   History of Present Illness  Pt is a 46 y/o F who was out walking her brother's large St. Bernard when the dog took off suddenly.  The pt fell and felt her leg twist awkwardly.  Imaging revealed displaced comminuted subtrochanteric L femur fx and pt is now s/p L IM nail.  Pt's PMH includes major depressive disorder, R lumbar radiculopathy, scitica, thoracic back pain, PVC, OCD, migraine, osteomyelitis, pt reported L tibia fx and surgical repair.    Clinical Impression  Pt admitted with above diagnosis. Pt currently with functional limitations due to the deficits listed below (see PT Problem List). Jerelene is very anxious about her pain and becomes tearful with bed mobility this morning.  She requires max verbal cues and encouragement during all mobility.  Min assist managing LLE for supine>sit, mod assist to boost to standing from bed, and close min guard assist to pivot to the chair.  Pt declined ambulation due to severe L hip pain.  She lives at home with her brother who just had back surgery and is in what sounds like a TLSO.  Pt has been assisting brother with laundry, walking the dogs, lifting, etc.  Pt is hopeful that either her daughter (31 y/o) or boyfriend will be able to stay with herself and brother at d/c to provide 24/7 assist/supervision.  Pt will need supervision for all OOB activity for safety. Anticipate that once pt's pain well controlled she will progress well with mobility. Pt will benefit from skilled PT to increase their independence and safety with mobility to allow discharge to the venue listed below.      Follow Up Recommendations Home health PT;Supervision for mobility/OOB    Equipment Recommendations  Rolling walker with 5" wheels;3in1 (PT)    Recommendations for Other Services OT consult     Precautions / Restrictions Precautions Precautions:  Fall Restrictions Weight Bearing Restrictions: Yes LLE Weight Bearing: Touchdown weight bearing      Mobility  Bed Mobility Overal bed mobility: Needs Assistance Bed Mobility: Supine to Sit     Supine to sit: Min assist;HOB elevated     General bed mobility comments: Max verbal cues and encouragement for technique. Pt declines attempting leg hook technique and this PT assists by holding but not advancing LLE to EOB.  Pt very anxious and tearful with bed mobility.  She uses bed rail to pull up to sitting and to assist with scooting EOB.  Increased time and effort.  Transfers Overall transfer level: Needs assistance Equipment used: Rolling walker (2 wheeled) Transfers: Sit to/from [employer] Sit to Stand: Mod assist;From elevated surface Stand pivot transfers: Min guard       General transfer comment: Bed elevated and mod assist to boost to standing.  Pain improves once pt standing and cues provided for upright posture.  Cues for hand placement with sit<>stand.  Pt hops with RW to pivot to chair performing NWB LLE.  Well controlled descent to chair.   Ambulation/Gait             General Gait Details: Pt declines ambualting due to L hip pain  Stairs            Wheelchair Mobility    Modified Rankin (Stroke Patients Only)       Balance Overall balance assessment: Needs assistance Sitting-balance support: No upper extremity supported;Feet supported Sitting balance-Leahy Scale: Good  Standing balance support: No upper extremity supported;During functional activity Standing balance-Leahy Scale: Poor Standing balance comment: Relies on RW for support as pt TDWB                             Pertinent Vitals/Pain Pain Assessment: Faces Faces Pain Scale: Hurts worst Pain Location: L hip Pain Descriptors / Indicators: Crying;Aching;Moaning;Grimacing;Guarding Pain Intervention(s): Limited activity within patient's  tolerance;Monitored during session;Repositioned;Patient requesting pain meds-RN notified    Home Living Family/patient expects to be discharged to:: Private residence Living Arrangements: Other relatives;Other (Comment) (brother who just had back surgery) Available Help at Discharge: Family;Friend(s);Available PRN/intermittently (potentially  24/7, pt to check on this) Type of Home: House Home Access: Stairs to enter Entrance Stairs-Rails: None Entrance Stairs-Number of Steps: 2 Home Layout: Two level;Able to live on main level with bedroom/bathroom (brother staying in bedroom, pt says she can sleep on couch) Home Equipment: None Additional Comments: Has a daughter and boyfriend nearby who might be able to provide 24/7 assist/supervision for her brother and herself. Her brother just had back surgery and it sounds like is in a TLSO.  Pt has been doing laundry, walking, the dogs, lifting, etc for the brother.    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 Denies any other falls in the past 6 months.      Hand Dominance        Extremity/Trunk Assessment   Upper Extremity Assessment Upper Extremity Assessment: Overall WFL for tasks assessed    Lower Extremity Assessment Lower Extremity Assessment: LLE deficits/detail LLE Deficits / Details: limited ROM and strength as expected s/p surgery documented above.  Bony prominence mid shaft of tibia which pt reports is from her prior fx and surgery to this site. LLE: Unable to fully assess due to pain LLE Sensation:  (WNL)    Cervical / Trunk Assessment Cervical / Trunk Assessment: Normal  Communication   Communication: No difficulties  Cognition Arousal/Alertness: Awake/alert Behavior During Therapy: Anxious Overall Cognitive Status: Within Functional Limits for tasks assessed                 General Comments: Pt very anxious saying, "I can't do  this", "How do I do this?", "It's going to be too painful"    General Comments      Exercises Total Joint Exercises Ankle Circles/Pumps: AROM;Both;10 reps;Supine Quad Sets: Strengthening;Both;10 reps;Supine Gluteal Sets: Strengthening;Both;10 reps;Supine Heel Slides: Strengthening;Left;Supine;5 reps   Assessment/Plan    PT Assessment Patient needs continued PT services  PT Problem List Decreased strength;Decreased range of motion;Decreased activity tolerance;Decreased balance;Decreased mobility;Decreased knowledge of use of DME;Decreased safety awareness;Pain          PT Treatment Interventions DME instruction;Gait training;Stair training;Functional mobility training;Therapeutic activities;Therapeutic exercise;Balance training;Neuromuscular re-education;Patient/family education;Modalities;Wheelchair mobility training    PT Goals (Current goals can be found in the Care Plan section)  Acute Rehab PT Goals Patient Stated Goal: decreased pain PT Goal Formulation: With patient Time For Goal Achievement: 09/06/16 Potential to Achieve Goals: Good    Frequency BID   Barriers to discharge Decreased caregiver support;Inaccessible home environment Steps to enter home and lives with brother who recently had back surgery, will need assist for OOB activity at d/c.    Co-evaluation               End of Session Equipment Utilized During Treatment:  Gait belt Activity Tolerance: Patient limited by pain;Other (comment) (limited by pt's anxiety) Patient left: in chair;with call bell/phone within reach;with chair alarm set;with nursing/sitter in room Nurse Communication: Mobility status;Patient requests pain meds;Weight bearing status         Time: [id number] PT Time Calculation (min) (ACUTE ONLY): 40 min   Charges:   PT Evaluation $PT Eval Low Complexity: 1 Procedure PT Treatments $Therapeutic Exercise: 8-22 mins $Therapeutic Activity: 8-22 mins   PT G Codes:         Encarnacion ChuAshley Lundon Verdejo PT, DPT 08/30/2016, 10:11 AM

## 2016-08-30 NOTE — Anesthesia Postprocedure Evaluation (Signed)
Anesthesia Post Note  Patient: Diane Villegas  Procedure(s) Performed: Procedure(s) (LRB): INTRAMEDULLARY (IM) NAIL INTERTROCHANTRIC (Left)  Patient location during evaluation: PACU Anesthesia Type: Spinal Level of consciousness: oriented and awake and alert Pain management: pain level controlled Vital Signs Assessment: post-procedure vital signs reviewed and stable Respiratory status: spontaneous breathing, respiratory function stable and patient connected to nasal cannula oxygen Cardiovascular status: blood pressure returned to baseline and stable Postop Assessment: no headache and no backache Anesthetic complications: no     Last Vitals:  Vitals:   08/30/16 0730 08/30/16 1113  BP: (!) 92/52 (!) 92/51  Pulse: 73 74  Resp: 18   Temp: 36.6 C 36.5 C    Last Pain:  Vitals:   08/30/16 1405  TempSrc:   PainSc: 8                  [name] S

## 2016-08-31 ENCOUNTER — Encounter: Payer: Self-pay | Admitting: Surgery

## 2016-08-31 LAB — CBC
HEMATOCRIT: 23.2 % — AB (ref 35.0–47.0)
Hemoglobin: 7.9 g/dL — ABNORMAL LOW (ref 12.0–16.0)
MCH: 31.7 pg (ref 26.0–34.0)
MCHC: 34.3 g/dL (ref 32.0–36.0)
MCV: 92.5 fL (ref 80.0–100.0)
Platelets: 183 10*3/uL (ref 150–440)
RBC: 2.51 MIL/uL — ABNORMAL LOW (ref 3.80–5.20)
RDW: 12.2 % (ref 11.5–14.5)
WBC: 4.4 10*3/uL (ref 3.6–11.0)

## 2016-08-31 LAB — BASIC METABOLIC PANEL
ANION GAP: 3 — AB (ref 5–15)
BUN: 7 mg/dL (ref 6–20)
CALCIUM: 7.9 mg/dL — AB (ref 8.9–10.3)
CO2: 21 mmol/L — AB (ref 22–32)
Chloride: 115 mmol/L — ABNORMAL HIGH (ref 101–111)
Creatinine, Ser: 0.69 mg/dL (ref 0.44–1.00)
Glucose, Bld: 125 mg/dL — ABNORMAL HIGH (ref 65–99)
Potassium: 3.5 mmol/L (ref 3.5–5.1)
Sodium: 139 mmol/L (ref 135–145)

## 2016-08-31 MED ORDER — ENOXAPARIN SODIUM 40 MG/0.4ML ~~LOC~~ SOLN: 40.0000 mg | SUBCUTANEOUS | 0 refills | Status: DC

## 2016-08-31 MED ORDER — HYDROMORPHONE HCL 2 MG PO TABS
2.0000 mg | ORAL_TABLET | ORAL | 0 refills | Status: DC | PRN
Start: 2016-08-31 — End: 2016-12-11

## 2016-08-31 MED ORDER — FERROUS SULFATE 325 (65 FE) MG PO TABS: 325.0000 mg | ORAL_TABLET | Freq: Two times a day (BID) | ORAL | 1 refills | Status: DC

## 2016-08-31 MED ORDER — FLUVOXAMINE MALEATE 50 MG PO TABS
150.0000 mg | ORAL_TABLET | Freq: Two times a day (BID) | ORAL | Status: DC
  Administered 2016-08-31 – 2016-09-01 (×2): 150 mg via ORAL
  Filled 2016-08-31 (×2): qty 3

## 2016-08-31 MED ORDER — FERROUS SULFATE 325 (65 FE) MG PO TABS
325.0000 mg | ORAL_TABLET | Freq: Two times a day (BID) | ORAL | Status: DC
  Administered 2016-08-31 – 2016-09-01 (×3): 325 mg via ORAL
  Filled 2016-08-31 (×3): qty 1

## 2016-08-31 NOTE — Care Management (Signed)
Faxed HH orders, demographics, H & P and otho notes to [facility] ([phone]) with reference # [id number]

## 2016-08-31 NOTE — Care Management (Signed)
TC to [facility] ([phone]). Requested authorization for home PT services. Intake ID # [id number].

## 2016-08-31 NOTE — Clinical Social Work Placement (Signed)
   CLINICAL SOCIAL WORK PLACEMENT  NOTE  Date:  08/31/2016  Patient Details  Name: Diane Villegas MRN: [id number] Date of Birth: 10/14/1970  Clinical Social Work is seeking post-discharge placement for this patient at the Skilled  Nursing Facility level of care (*CSW will initial, date and re-position this form in  chart as items are completed):  Yes   Patient/family provided with [facility] Clinical Social Work Department's list of facilities offering this level of care within the geographic area requested by the patient (or if unable, by the patient's family).  Yes   Patient/family informed of their freedom to choose among providers that offer the needed level of care, that participate in Medicare, Medicaid or managed care program needed by the patient, have an available bed and are willing to accept the patient.  Yes   Patient/family informed of [facility]'s ownership interest in [facility] and [facility], as well as of the fact that they are under no obligation to receive care at these facilities.  PASRR submitted to EDS on 08/31/16     PASRR number received on 08/31/16     Existing PASRR number confirmed on       FL2 transmitted to all facilities in geographic area requested by pt/family on 08/31/16     FL2 transmitted to all facilities within larger geographic area on       Patient informed that his/her managed care company has contracts with or will negotiate with certain facilities, including the following:            Patient/family informed of bed offers received.  Patient chooses bed at       Physician recommends and patient chooses bed at      Patient to be transferred to   on  .  Patient to be transferred to facility by       Patient family notified on   of transfer.  Name of family member notified:        PHYSICIAN       Additional Comment:    _______________________________________________ Kanav Kazmierczak, Darleen CrockerBailey M, LCSW 08/31/2016, 8:05 PM

## 2016-08-31 NOTE — Progress Notes (Addendum)
Physical Therapy Treatment Patient Details Name: Diane Villegas MRN: [id number] DOB: 1970/10/07 Today's Date: 08/31/2016    History of Present Illness Pt is a 46 y/o F who was out walking her brother's large [employer] when the dog took off suddenly.  The pt fell and felt her leg twist awkwardly.  Imaging revealed displaced comminuted subtrochanteric L femur fx and pt is now s/p L IM nail.  Pt's PMH includes major depressive disorder, R lumbar radiculopathy, scitica, thoracic back pain, PVC, OCD, migraine, osteomyelitis, pt reported L tibia fx and surgical repair.    PT Comments    Pt agreeable to PT; notes pain in left hip is a little better, but continues to be limiting (7/10). Pt moves very guarded and notes issues with sharp muscle spasm pain. Pt requires assistance for Left lower extremity movement exercises and moves slowly with limited range. Education on breathing and, self assisted movement and decreased intensity of isometrics to control guard/spasms. Pt requires Min A and cues for weight shift and hand placement for transfers. Education for TDW status with static stand and ambulation. Pt initially ambulates with NWB status and no active swing phase of Left lower extremity; improved with education/cues. Pt progresses ambulation to 36 feet today. It is noted that pt has 3 steps to enter her home off of a sloped driveway and sidewalk; the stairs have no railing. Currently pt's bedroom/bath upstairs with >  15 steps; pt states she may be able to sleep on the couch. Recommendation for post care questionable due to pt's limitation with ambulation and stair climbing and decreased weightbearing status at this time. Spoke with both CM and SW regarding. Will continue to monitor progress; plan to see pt this afternoon for continued progress of strength and function.   Follow Up Recommendations  Home health PT;Supervision for mobility/OOB;Other (comment) (May need SNF depending on amb/stairs)      Equipment Recommendations  Rolling walker with 5" wheels;3in1 (PT)    Recommendations for Other Services       Precautions / Restrictions Precautions Precautions: Fall Restrictions Weight Bearing Restrictions: Yes LLE Weight Bearing: Touchdown weight bearing    Mobility  Bed Mobility               General bed mobility comments: Not tested; up in chair  Transfers Overall transfer level: Needs assistance Equipment used: Rolling walker (2 wheeled) Transfers: Sit to/from Stand Sit to Stand: Min assist         General transfer comment: Min A from recliner and BSC; cues for hand placement, sequence and wt shift  Ambulation/Gait Ambulation/Gait assistance: Min guard Ambulation: 36 ft (initial 3ft to/from [facility]) Assistive device: Rolling walker (2 wheeled) Gait Pattern/deviations: Step-to pattern (L TDW) Gait velocity: reduced Gait velocity interpretation: <1.8 ft/sec, indicative of risk for recurrent falls General Gait Details: Initially NWB on L; encouraged sequence with TDW; improved with continued work   Information systems manager Rankin (Stroke Patients Only)       Balance Overall balance assessment: Needs assistance Sitting-balance support: Feet supported Sitting balance-Leahy Scale: Good     Standing balance support: Bilateral upper extremity supported Standing balance-Leahy Scale: Fair                      Cognition Arousal/Alertness: Awake/alert Behavior During Therapy: WFL for tasks assessed/performed;Anxious Overall Cognitive Status: Within Functional Limits for tasks assessed  General Comments: Moderately nervous/anxious with LLE exercise/ambulatoin, but improving and responds well to cues/encouragement    Exercises General Exercises - Lower Extremity Ankle Circles/Pumps: AROM;Both;20 reps Quad Sets: Strengthening;Both;20 reps Gluteal Sets: Strengthening;Both;20 reps Long Arc  Quad: AAROM;Left;10 reps;Seated Hip Flexion/Marching: AAROM;Left;10 reps;Seated Toe Raises: AROM;Both;10 reps;Seated Heel Raises: AROM;Both;10 reps;Seated    General Comments        Pertinent Vitals/Pain Pain Assessment: 0-10 Pain Score: 7  Pain Location: L hip/thigh to knee Pain Descriptors / Indicators: Aching;Constant;Sharp Pain Intervention(s): Monitored during session;Premedicated before session    Home Living                      Prior Function            PT Goals (current goals can now be found in the care plan section)      Frequency    BID      PT Plan Current plan remains appropriate    Co-evaluation             End of Session Equipment Utilized During Treatment: Gait belt Activity Tolerance: Patient limited by pain Patient left: in chair;with call bell/phone within reach (refused chair alarm)     Time: 1110-1144 PT Time Calculation (min) (ACUTE ONLY): 34 min  Charges:  $Gait Training: 8-22 mins $Therapeutic Exercise: 8-22 mins                    G CodesScot [place]:      Diane Villegas, PTA 08/31/2016, 1:07 PM

## 2016-08-31 NOTE — Clinical Social Work Note (Signed)
Clinical Social Work Assessment  Patient Details  Name: Diane Villegas MRN: [id number] Date of Birth: 1971-03-17  Date of referral:  08/31/16               Reason for consult:  Facility Placement                Permission sought to share information with:  Chartered certified accountant granted to share information::  Yes, Verbal Permission Granted  Name::      [place]::   [place]   Relationship::     Contact Information:     Housing/Transportation Living arrangements for the past 2 months:  [place] of Information:  Patient Patient Interpreter Needed:  None Criminal Activity/Legal Involvement Pertinent to Current Situation/Hospitalization:  No - Comment as needed Significant Relationships:  Friend, Siblings Lives with:  Siblings Do you feel safe going back to the place where you live?  Yes Need for family participation in patient care:  No (Coment)  Care giving concerns:  Patient lives in [place] with her brother [name].    Social Worker assessment / plan:  Holiday representative (CSW) received verbal consult from PT that recommendation is home health however patient is on the borderline of needing SNF. CSW met with patient at bedside to discuss D/C plan. Patient's friend [name] was also at bedside. CSW introduced self and explained role of CSW department. CSW explained SNF process and that [name] would have to approve SNF. CSW explained that [facility] is one of the only SNF's that are in network with [facility] in [place]. Patient reported that she prefers to go home and believes that she can manage at home. Patient reported that she fell walking her brother's dog because she was helping him out because he just had back surgery. Patient is agreeable to SNF as a back up plan. Patient plans on going home with home health. RN case manager aware of above. CSW will continue to follow and assist as needed.    Employment status:  Psychologist, counselling:  Managed Care PT Recommendations:  [place], Home with [place] / Referral to community resources:  [place]  Patient/Family's Response to care:  Patient prefers to go home but is agreeable to SNF search as a back up plan.   Patient/Family's Understanding of and Emotional Response to Diagnosis, Current Treatment, and Prognosis:  Patient was very pleasant and thanked CSW for visit.   Emotional Assessment Appearance:  Appears stated age Attitude/Demeanor/Rapport:    Affect (typically observed):  Accepting, Adaptable Orientation:  Oriented to Self, Oriented to Place, Oriented to  Time, Oriented to Situation Alcohol / Substance use:  Not Applicable Psych involvement (Current and /or in the community):  No (Comment)  Discharge Needs  Concerns to be addressed:  Discharge Planning Concerns Readmission within the last 30 days:  No Current discharge risk:  Dependent with Mobility Barriers to Discharge:  Continued Medical Work up   [employer], Veronia Beets, LCSW 08/31/2016, 8:06 PM

## 2016-08-31 NOTE — [place] FL2 (Signed)
[place] MEDICAID FL2 LEVEL OF CARE SCREENING TOOL     IDENTIFICATION  Patient Name: Diane Villegas Birthdate: Jun 27, 1971 Sex: female Admission Date (Current Location): 08/29/2016  [place] Number:  Chiropodist and Address:  [facility], [address] [place], [place] [address]      Provider Number: [id number]  Attending Physician Name and Address:  Christena Flake, MD  Relative Name and Phone Number:       Current Level of Care: Hospital Recommended Level of Care: Skilled Nursing Facility Prior Approval Number:    Date Approved/Denied:   PASRR Number:  [id number])  Discharge Plan: SNF    Current Diagnoses: Patient Active Problem List   Diagnosis Date Noted  . Closed left subtrochanteric femur fracture (HCC) 08/29/2016  . Calculus of bile duct 04/23/2015  . Gastro-esophageal reflux disease without esophagitis 09/04/2014  . MDD (major depressive disorder)   . MDD (major depressive disorder), recurrent episode, moderate (HCC) 03/14/2014  . Inflammatory disorder of extremity 11/02/2013  . LUMBAR RADICULOPATHY, RIGHT 04/08/2010  . SCIATICA 03/21/2010  . CIGARETTE SMOKER 06/27/2009  . BACK PAIN, THORACIC REGION 04/01/2009  . HYPOTHYROIDISM 11/19/2008  . PREMATURE VENTRICULAR CONTRACTIONS 11/19/2008  . OCD (obsessive compulsive disorder) 06/19/2008  . MIGRAINE HEADACHE 06/19/2008    Orientation RESPIRATION BLADDER Height & Weight     Self, Time, Situation, Place  Normal Continent Weight: 145 lb 8 oz (66 kg) Height:  5\' 10"  (177.8 cm)  BEHAVIORAL SYMPTOMS/MOOD NEUROLOGICAL BOWEL NUTRITION STATUS   (none )  (none ) Continent Diet (Regular Diet )  AMBULATORY STATUS COMMUNICATION OF NEEDS Skin   Extensive Assist Verbally Surgical wounds (Incision: Left Hip. )                       Personal Care Assistance Level of Assistance  Bathing, Feeding, Dressing Bathing Assistance: Limited assistance Feeding assistance:  Independent Dressing Assistance: Limited assistance     Functional Limitations Info  Sight, Hearing, Speech Sight Info: Adequate Hearing Info: Adequate Speech Info: Adequate    SPECIAL CARE FACTORS FREQUENCY  PT (By licensed PT), OT (By licensed OT)     PT Frequency:  (5) OT Frequency:  (5)            Contractures      Additional Factors Info  Code Status, Allergies Code Status Info:  (Full Code. ) Allergies Info:  (Oxycodone, Cefazolin, Codeine, Moxifloxacin, Tramadol Hcl)           Current Medications (08/31/2016):  This is the current hospital active medication list Current Facility-Administered Medications  Medication Dose Route Frequency Provider Last Rate Last Dose  . acetaminophen (TYLENOL) tablet 650 mg  650 mg Oral Q6H PRN Christena Flake, MD   650 mg at 08/30/16 0850   Or  . acetaminophen (TYLENOL) suppository 650 mg  650 mg Rectal Q6H PRN Christena Flake, MD      . amoxicillin-clavulanate (AUGMENTIN) 875-125 MG per tablet 1 tablet  1 tablet Oral BID Christena Flake, MD   1 tablet at 08/31/16 1029  . bisacodyl (DULCOLAX) suppository 10 mg  10 mg Rectal Daily PRN Christena Flake, MD      . busPIRone (BUSPAR) tablet 15 mg  15 mg Oral BID Christena Flake, MD   15 mg at 08/31/16 1029  . dextrose 5 % and 0.9 % NaCl with KCl 20 mEq/L infusion   Intravenous Continuous Christena Flake, MD 20 mL/hr  at 08/31/16 0132    . diphenhydrAMINE (BENADRYL) 12.5 MG/5ML elixir 12.5-25 mg  12.5-25 mg Oral Q4H PRN Christena FlakeJohn J Poggi, MD      . docusate sodium (COLACE) capsule 100 mg  100 mg Oral BID Christena FlakeJohn J Poggi, MD   100 mg at 08/31/16 1029  . enoxaparin (LOVENOX) injection 40 mg  40 mg Subcutaneous Q24H Christena FlakeJohn J Poggi, MD   40 mg at 08/31/16 0818  . ferrous sulfate tablet 325 mg  325 mg Oral BID WC Christena FlakeJohn J Poggi, MD   325 mg at 08/31/16 1809  . fluvoxaMINE (LUVOX) tablet 150 mg  150 mg Oral BID Christena FlakeJohn J Poggi, MD      . HYDROmorphone (DILAUDID) injection 1-2 mg  1-2 mg Intravenous Q2H PRN Christena FlakeJohn J Poggi, MD   1  mg at 08/29/16 2000  . HYDROmorphone (DILAUDID) tablet 2-4 mg  2-4 mg Oral Q4H PRN Christena FlakeJohn J Poggi, MD   4 mg at 08/31/16 1809  . Influenza vac split quadrivalent PF (FLUARIX) injection 0.5 mL  0.5 mL Intramuscular Tomorrow-1000 Christena FlakeJohn J Poggi, MD      . levothyroxine (SYNTHROID, LEVOTHROID) tablet 50 mcg  50 mcg Oral QAC breakfast Christena FlakeJohn J Poggi, MD   50 mcg at 08/31/16 0818  . magic mouthwash  10 mL Oral QID PRN Christena FlakeJohn J Poggi, MD   10 mL at 08/31/16 1814  . magnesium hydroxide (MILK OF MAGNESIA) suspension 30 mL  30 mL Oral Daily PRN Christena FlakeJohn J Poggi, MD      . metoCLOPramide (REGLAN) tablet 5-10 mg  5-10 mg Oral Q8H PRN Christena FlakeJohn J Poggi, MD       Or  . metoCLOPramide (REGLAN) injection 5-10 mg  5-10 mg Intravenous Q8H PRN Christena FlakeJohn J Poggi, MD      . metroNIDAZOLE (METROCREAM) 0.75 % cream 1 application  1 application Topical BID Christena FlakeJohn J Poggi, MD   1 application at 08/31/16 1031  . nicotine (NICODERM CQ - dosed in mg/24 hours) patch 14 mg  14 mg Transdermal Daily Christena FlakeJohn J Poggi, MD   14 mg at 08/31/16 1030  . ondansetron (ZOFRAN) tablet 4 mg  4 mg Oral Q6H PRN Christena FlakeJohn J Poggi, MD       Or  . ondansetron [facility](ZOFRAN) injection 4 mg  4 mg Intravenous Q6H PRN Christena FlakeJohn J Poggi, MD      . sodium phosphate (FLEET) 7-19 GM/118ML enema 1 enema  1 enema Rectal Once PRN Christena FlakeJohn J Poggi, MD      . topiramate (TOPAMAX) tablet 200 mg  200 mg Oral Daily Christena FlakeJohn J Poggi, MD   200 mg at 08/31/16 1029     Discharge Medications: Please see discharge summary for a list of discharge medications.  Relevant Imaging Results:  Relevant Lab Results:   Additional Information  (SSN: [id number])  Romen Yutzy, Darleen CrockerBailey M, LCSW

## 2016-08-31 NOTE — Progress Notes (Signed)
Physical Therapy Treatment Patient Details Name: Diane Villegas MRN: [id number] DOB: 1970-09-08 Today's Date: 08/31/2016    History of Present Illness Pt is a 46 y/o F who was out walking her brother's large [employer] when the dog took off suddenly.  The pt fell and felt her leg twist awkwardly.  Imaging revealed displaced comminuted subtrochanteric L femur fx and pt is now s/p L IM nail.  Pt's PMH includes major depressive disorder, R lumbar radiculopathy, scitica, thoracic back pain, PVC, OCD, migraine, osteomyelitis, pt reported L tibia fx and surgical repair.    PT Comments    Pt in bed lethargic/fatigued. Pain continues significant (7/10) left hip/thigh. Pt requires Min to Mod A for lower extremities for in/out of bed. Continues to require Min A for transfers and Min guard for short ambulation. Pt does not wish to ambulate further at this time due to pain and lethargy. Pt requires assist for Left lower extremity supine mobility exercises. Pt plans to speak with siblings regarding increased care/assist at home post discharge. Continue PT to progress strength, endurance to improve all functional mobility.   Follow Up Recommendations  Home health PT;Supervision for mobility/OOB (pt going to seek add'l help for home)     Equipment Recommendations  Rolling walker with 5" wheels;3in1 (PT)    Recommendations for Other Services       Precautions / Restrictions Precautions Precautions: Fall Restrictions Weight Bearing Restrictions: Yes LLE Weight Bearing: Touchdown weight bearing    Mobility  Bed Mobility Overal bed mobility: Needs Assistance Bed Mobility: Supine to Sit;Sit to Supine     Supine to sit: Min assist Sit to supine: Mod assist   General bed mobility comments: Assist for LEs  Transfers Overall transfer level: Needs assistance Equipment used: Rolling walker (2 wheeled) Transfers: Sit to/from Stand Sit to Stand: Min assist         General transfer comment: Cues  for hand placement/wt shift, Slow to rise/sit  Ambulation/Gait Ambulation/Gait assistance: Min guard Ambulation Distance (Feet): 3 Feet Assistive device: Rolling walker (2 wheeled) Gait Pattern/deviations: Step-to pattern (L TDW) Gait velocity: reduced Gait velocity interpretation: <1.8 ft/sec, indicative of risk for recurrent falls General Gait Details: to/from [facility]   Stairs            Wheelchair Mobility    Modified Rankin (Stroke Patients Only)       Balance Overall balance assessment: Needs assistance Sitting-balance support: Feet supported Sitting balance-Leahy Scale: Good     Standing balance support: Bilateral upper extremity supported Standing balance-Leahy Scale: Fair                      Cognition Arousal/Alertness: Lethargic Behavior During Therapy: WFL for tasks assessed/performed;Anxious Overall Cognitive Status: Within Functional Limits for tasks assessed                 General Comments: Moderately nervous/anxious with LLE exercise/ambulatoin, but improving and responds well to cues/encouragement    Exercises General Exercises - Lower Extremity Ankle Circles/Pumps: AROM;Both;10 reps Quad Sets: Strengthening;Both;10 reps Gluteal Sets: Strengthening;Both;10 reps Short Arc Quad: AAROM;Left;10 reps;Supine Long Arc Quad: AAROM;Left;10 reps;Seated Heel Slides: AAROM;Left;10 reps;Supine Hip ABduction/ADduction: AAROM;Left;10 reps;Supine Hip Flexion/Marching: AAROM;Left;10 reps;Seated Toe Raises: AROM;Both;10 reps;Seated Heel Raises: AROM;Both;10 reps;Seated    General Comments        Pertinent Vitals/Pain Pain Assessment: 0-10 Pain Score: 7  Pain Location: L hip/thigh to knee Pain Descriptors / Indicators: Aching;Constant;Sharp Pain Intervention(s): Limited activity within patient's tolerance;Premedicated before session  Home Living                      Prior Function            PT Goals (current goals can now  be found in the care plan section) Progress towards PT goals: Progressing toward goals    Frequency    BID      PT Plan Current plan remains appropriate    Co-evaluation             End of Session Equipment Utilized During Treatment: Gait belt Activity Tolerance: Patient limited by pain;Patient limited by fatigue;Patient limited by lethargy Patient left: in bed;with call bell/phone within reach;with bed alarm set (refused chair alarm)     Time: 1331-1401 PT Time Calculation (min) (ACUTE ONLY): 30 min  Charges:  $Gait Training: 8-22 mins $Therapeutic Exercise: 8-22 mins                    G Codes:      Scot DockHeidi E Barnes, PTA 08/31/2016, 2:09 PM

## 2016-08-31 NOTE — Progress Notes (Signed)
Subjective: 2 Days Post-Op Procedure(s) (LRB): INTRAMEDULLARY (IM) NAIL INTERTROCHANTRIC (Left) Patient reports pain as moderate.   Patient is well, and has had no acute complaints or problems Plan is to go Home versus rehabilitation after hospital stay. Negative for chest pain and shortness of breath Fever: no Gastrointestinal: Negative for nausea and vomiting  Objective: Vital signs in last 24 hours: Temp:  [97.7 F (36.5 C)-98 F (36.7 C)] 97.8 F (36.6 C) (01/08 0506) Pulse Rate:  [74-82] 82 (01/08 0506) Resp:  [18] 18 (01/08 0506) BP: (92-110)/(51-60) 110/60 (01/08 0506) SpO2:  [100 %] 100 % (01/08 0506)  Intake/Output from previous day:  Intake/Output Summary (Last 24 hours) at 08/31/16 0753 Last data filed at 08/30/16 1358  Gross per 24 hour  Intake              480 ml  Output              300 ml  Net              180 ml    Intake/Output this shift: No intake/output data recorded.  Labs:  Recent Labs  08/29/16 0828 08/30/16 0430 08/31/16 0357  HGB 13.6 9.0* 7.9*    Recent Labs  08/30/16 0430 08/31/16 0357  WBC 4.2 4.4  RBC 2.85* 2.51*  HCT 26.6* 23.2*  PLT 203 183    Recent Labs  08/30/16 0430 08/31/16 0357  NA 138 139  K 3.5 3.5  CL 115* 115*  CO2 20* 21*  BUN 11 7  CREATININE 0.75 0.69  GLUCOSE 120* 125*  CALCIUM 7.8* 7.9*    Recent Labs  08/29/16 0828  INR 0.92     EXAM General - Patient is Alert and Oriented Extremity - Sensation intact distally Dorsiflexion/Plantar flexion intact No cellulitis present Compartment soft Dressing/Incision - clean, dry, blood tinged drainage Motor Function - intact, moving foot and toes well on exam.   Abd soft on exam, normal BS.  Past Medical History:  Diagnosis Date  . History of hypothyroidism    Off meds  . History of meningitis   . IBS (irritable bowel syndrome)   . MDD (major depressive disorder)   . Migraine    [facility]  . OCD (obsessive compulsive  disorder)   . Osteomyelitis (HCC)     Assessment/Plan: 2 Days Post-Op Procedure(s) (LRB): INTRAMEDULLARY (IM) NAIL INTERTROCHANTRIC (Left) Active Problems:   Closed left subtrochanteric femur fracture (HCC)  Estimated body mass index is 20.88 kg/m as calculated from the following:   Height as of this encounter: 5\' 10"  (1.778 m).   Weight as of this encounter: 66 kg (145 lb 8 oz). Up with therapy   Hemoglobin 7.9. Acute blood loss anemia secondary to fractured femur and surgery.  Started on iron supplementation. Pt denies having bowel movement, normal BS on exam. Plan will be for discharge possibly today or tomorrow, will progress with PT at this time. CBC and BMP ordered for tomorrow morning.  DVT Prophylaxis - Lovenox, Foot Pumps and TED hose Touchdown Weight-Bearing to left leg  J. Horris LatinoLance McGhee, PA-C Orthopaedic Surgery 08/31/2016, 7:53 AM

## 2016-08-31 NOTE — Progress Notes (Signed)
[facility]. Pt appeared pleasant, [facility], the brothers' surgery, and was optimistic about her recovery. CH provided the ministry of presence.    08/31/16 1400  Clinical Encounter Type  Visited With Patient  Visit Type Initial;Spiritual support  Referral From Nurse  Consult/Referral To Chaplain  Spiritual Encounters  Spiritual Needs Prayer;Other (Comment)

## 2016-09-01 LAB — BASIC METABOLIC PANEL
Anion gap: 3 — ABNORMAL LOW (ref 5–15)
BUN: 6 mg/dL (ref 6–20)
CHLORIDE: 114 mmol/L — AB (ref 101–111)
CO2: 22 mmol/L (ref 22–32)
CREATININE: 0.74 mg/dL (ref 0.44–1.00)
Calcium: 8 mg/dL — ABNORMAL LOW (ref 8.9–10.3)
Glucose, Bld: 98 mg/dL (ref 65–99)
POTASSIUM: 3.8 mmol/L (ref 3.5–5.1)
SODIUM: 139 mmol/L (ref 135–145)

## 2016-09-01 LAB — CBC
HEMATOCRIT: 23.2 % — AB (ref 35.0–47.0)
Hemoglobin: 8 g/dL — ABNORMAL LOW (ref 12.0–16.0)
MCH: 31.5 pg (ref 26.0–34.0)
MCHC: 34.5 g/dL (ref 32.0–36.0)
MCV: 91.4 fL (ref 80.0–100.0)
PLATELETS: 237 10*3/uL (ref 150–440)
RBC: 2.53 MIL/uL — AB (ref 3.80–5.20)
RDW: 12.1 % (ref 11.5–14.5)
WBC: 5.5 10*3/uL (ref 3.6–11.0)

## 2016-09-01 LAB — VITAMIN D 25 HYDROXY (VIT D DEFICIENCY, FRACTURES): VIT D 25 HYDROXY: 18.3 ng/mL — AB (ref 30.0–100.0)

## 2016-09-01 MED ORDER — VITAMIN D (ERGOCALCIFEROL) 1.25 MG (50000 UNIT) PO CAPS
50000.0000 [IU] | ORAL_CAPSULE | ORAL | Status: DC
  Administered 2016-09-01: 50000 [IU] via ORAL
  Filled 2016-09-01 (×2): qty 1

## 2016-09-01 MED ORDER — VITAMIN D (ERGOCALCIFEROL) 1.25 MG (50000 UNIT) PO CAPS: 50000.0000 [IU] | ORAL_CAPSULE | ORAL | 0 refills | Status: DC

## 2016-09-01 NOTE — Progress Notes (Signed)
Physical Therapy Treatment Patient Details Name: Diane Villegas MRN: [id number] DOB: 17-Jun-1971 Today's Date: 09/01/2016    History of Present Illness Pt is a 46 y/o F who was out walking her brother's large [employer] when the dog took off suddenly.  The pt fell and felt her leg twist awkwardly.  Imaging revealed displaced comminuted subtrochanteric L femur fx and pt is now s/p L IM nail.  Pt's PMH includes major depressive disorder, R lumbar radiculopathy, scitica, thoracic back pain, PVC, OCD, migraine, osteomyelitis, pt reported L tibia fx and surgical repair.    PT Comments    Pt ready for session.  Bed mobility improving with increased time.  She is able to self-manage LLE today.  To/from bathroom with supervision and does well with TDWB.  She was able to ambulate 40' in hallway without  LOB.  Most of session focused on stair training as it her biggest obstacle for discharge at this time.  She has 3 stairs to enter with no handrails.  Stated having them installed is not an option as it is her brothers home and he is in the process of selling it.  Pt was taught how to go up/down stairs in a seated position.  She as able to navigate well with increased time and discussed how to get up off the floor once in the house by using a step stool to boost up to and then to a chair/couch.  While she stated it would be challenging for her she felt it was a good option.  Encouraged to have +2 assist upon discharge for safety and ease.  Will attempt again during pm session to increase her comfort.  Anticipate discharge home today.   Follow Up Recommendations  Home health PT;Supervision for mobility/OOB     Equipment Recommendations  Rolling walker with 5" wheels;3in1 (PT)    Recommendations for Other Services OT consult     Precautions / Restrictions Precautions Precautions: Fall Restrictions Weight Bearing Restrictions: Yes LLE Weight Bearing: Touchdown weight bearing    Mobility  Bed  Mobility Overal bed mobility: Needs Assistance Bed Mobility: Supine to Sit;Sit to Supine     Supine to sit: Supervision Sit to supine: Supervision   General bed mobility comments: increased time  Transfers Overall transfer level: Needs assistance Equipment used: Rolling walker (2 wheeled) Transfers: Sit to/from Stand Sit to Stand: Min assist;Min guard Stand pivot transfers: Min guard       General transfer comment: Cues for hand placement/wt shift, Slow to rise/sit  Ambulation/Gait Ambulation/Gait assistance: Min guard Ambulation Distance (Feet): 40 Feet Assistive device: Rolling walker (2 wheeled) Gait Pattern/deviations: Step-to pattern Gait velocity: reduced       Stairs Stairs: Yes   Stair Management: Seated/boosting Number of Stairs: 2 General stair comments: Pt educated and was able to demo up/down stairs in a seated position.  Pt overall did well but will need 2 assist for sitting to and standing from lower surfaces.    Wheelchair Mobility    Modified Rankin (Stroke Patients Only)       Balance Overall balance assessment: Needs assistance Sitting-balance support: Feet supported Sitting balance-Leahy Scale: Good     Standing balance support: Bilateral upper extremity supported Standing balance-Leahy Scale: Fair Standing balance comment: Relies on RW for support as pt TDWB                    Cognition Arousal/Alertness: Lethargic Behavior During Therapy: WFL for tasks assessed/performed;Anxious Overall Cognitive Status: Within  Functional Limits for tasks assessed                 General Comments: Less anxiety today.    Exercises Other Exercises Other Exercises: to bathroom with supervision    General Comments        Pertinent Vitals/Pain Pain Assessment: 0-10 Pain Score: 4  Pain Location: L hip/thigh to knee Pain Descriptors / Indicators: Aching;Constant;Sharp    Home Living                      Prior Function             PT Goals (current goals can now be found in the care plan section) Acute Rehab PT Goals Potential to Achieve Goals: Good Progress towards PT goals: Progressing toward goals    Frequency    BID      PT Plan Current plan remains appropriate    Co-evaluation             End of Session Equipment Utilized During Treatment: Gait belt Activity Tolerance: Patient limited by fatigue;Patient limited by pain Patient left: in bed;with call bell/phone within reach;with bed alarm set     Time: [id number] PT Time Calculation (min) (ACUTE ONLY): 35 min  Charges:  $Gait Training: 8-22 mins $Therapeutic Activity: 8-22 mins                    G Codes:      Diane Villegas 09/01/2016, 10:26 AM

## 2016-09-01 NOTE — Discharge Instructions (Signed)
Diet: As you were doing prior to hospitalization   Shower:  May shower but keep the wounds dry, use an occlusive plastic wrap, NO SOAKING IN TUB.  If the bandage gets wet, change with a clean dry gauze.  Dressing:  You may change your dressing as needed. Change the dressing with sterile gauze dressing.    Activity:  Increase activity slowly as tolerated, but follow the weight bearing instructions below.  No lifting or driving for 6 weeks.  Weight Bearing:   Toe-touch weightbearing to the left lower extremity.  To prevent constipation: you may use a stool softener such as -  Colace (over the counter) 100 mg by mouth twice a day  Drink plenty of fluids (prune juice may be helpful) and high fiber foods Miralax (over the counter) for constipation as needed.    Itching:  If you experience itching with your medications, try taking only a single pain pill, or even half a pain pill at a time.  You may take up to 10 pain pills per day, and you can also use benadryl over the counter for itching or also to help with sleep.   Precautions:  If you experience chest pain or shortness of breath - call 911 immediately for transfer to the hospital emergency department!!  If you develop a fever greater that 101 F, purulent drainage from wound, increased redness or drainage from wound, or calf pain-Call [facility]                                              Follow- Up Appointment:  [facility]  Vitamin D supplement weekly for the next 8 weeks prescribed and sent to your pharmacy.  Please discuss your vitamin D levels with your primary care physician upon discharge.

## 2016-09-01 NOTE — Progress Notes (Signed)
All discharge instructions reviewed with patient. Patient verbalizes understanding. Prescriptions given to patient.

## 2016-09-01 NOTE — Care Management Note (Signed)
Case Management Note  Patient Details  Name: Gilford Rileaige S Wooton MRN: [id number] Date of Birth: 08/06/1971  Subjective/Objective:  Spoke with [employer] [phone], ext. [phone]). "They have been unsuccessful at this point in finding a home care company that will accept her.  They are willing to pay for out of network benefits. I spoke with [facility] and they declined referral." Per [employer], "[facility]  has declined. They are willing to approve/pay rehabilitation and outpatient PT."  Patient walked 40 feet. CSW has assessed patient and patient has declined SNF.  RNCM has requested [facility] look at accepting patient.                Action/Plan: Walker and BSC has been delivered by [facility].   Expected Discharge Date:                  Expected Discharge Plan:  Home w Home Health Services  In-House Referral:     Discharge planning Services  CM Consult  Post Acute Care Choice:  Durable Medical Equipment, Home Health Choice offered to:  Patient  DME Arranged:  Dan HumphreysWalker, Bedside commode DME Agency:  [facility]  [facility]:     Status of Service:  In process, will continue to follow  If discussed at Long Length of Stay Meetings, dates discussed:    Additional Comments:  Marily MemosLisa M Bernie Ransford, RN 09/01/2016, 2:10 PM

## 2016-09-01 NOTE — Discharge Summary (Signed)
Physician Discharge Summary  Patient ID: Diane Villegas MRN: [id number] DOB/AGE: 1971-06-16 46 y.o.  Admit date: 08/29/2016 Discharge date: 09/01/2016  Admission Diagnoses:  Hip fracture, left (HCC) [S72.002A] Closed fracture of proximal end of left femur, initial encounter (HCC) [S72.002A] Closed displaced, comminuted subtrochanteric fracture of the left femur.  Discharge Diagnoses: Patient Active Problem List   Diagnosis Date Noted  . Closed left subtrochanteric femur fracture (HCC) 08/29/2016  . Calculus of bile duct 04/23/2015  . Gastro-esophageal reflux disease without esophagitis 09/04/2014  . MDD (major depressive disorder)   . MDD (major depressive disorder), recurrent episode, moderate (HCC) 03/14/2014  . Inflammatory disorder of extremity 11/02/2013  . LUMBAR RADICULOPATHY, RIGHT 04/08/2010  . SCIATICA 03/21/2010  . CIGARETTE SMOKER 06/27/2009  . BACK PAIN, THORACIC REGION 04/01/2009  . HYPOTHYROIDISM 11/19/2008  . PREMATURE VENTRICULAR CONTRACTIONS 11/19/2008  . OCD (obsessive compulsive disorder) 06/19/2008  . MIGRAINE HEADACHE 06/19/2008  Closed displaced, comminuted subtrochanteric fracture of the left femur.  Past Medical History:  Diagnosis Date  . History of hypothyroidism    Off meds  . History of meningitis   . IBS (irritable bowel syndrome)   . MDD (major depressive disorder)   . Migraine    Dr. Catalina Lunger at the [facility]  . OCD (obsessive compulsive disorder)   . Osteomyelitis (HCC)    Transfusion: None   Consultants (if any):   Discharged Condition: Improved  Hospital Course: Diane Villegas is an 46 y.o. female who was admitted 08/29/2016 with a diagnosis of a closed displaced, comminuted subtrochanteric fracture of the left femur and went to the operating room on 08/29/2016 and underwent the above named procedures.    Surgeries: Procedure(s): INTRAMEDULLARY (IM) NAIL INTERTROCHANTRIC on 08/29/2016 Patient tolerated the surgery well. Taken to PACU  where she was stabilized and then transferred to the orthopedic floor.  Started on Lovenox 40mg  q 24 hrs. Foot pumps applied bilaterally at 80 mm. Heels elevated on bed with rolled towels. No evidence of DVT. Negative Homan. Physical therapy started on day #1 for gait training and transfer. OT started day #1 for ADL and assisted devices.  Patient's IV and foley were d/c on POD1.  Implants:  Biomet Affixis 11 x 380 mm TFN with a 100 mm lag screw, and 44 mm and 48 mm distal interlocking screws.  She was given perioperative antibiotics:  Anti-infectives    Start     Dose/Rate Route Frequency Ordered Stop   08/29/16 2200  amoxicillin-clavulanate (AUGMENTIN) 875-125 MG per tablet 1 tablet    Comments:  Patient taking differently: Last day 09-04-2016     1 tablet Oral 2 times daily 08/29/16 1545     08/29/16 1800  clindamycin (CLEOCIN) IVPB 900 mg     900 mg 100 mL/hr over 30 Minutes Intravenous Every 6 hours 08/29/16 1545 08/30/16 0643   08/29/16 1130  clindamycin (CLEOCIN) IVPB 900 mg     900 mg 100 mL/hr over 30 Minutes Intravenous  Once 08/29/16 1124 08/29/16 1233    .  She was given sequential compression devices, early ambulation, and Lovenox for DVT prophylaxis.  She benefited maximally from the hospital stay and there were no complications.    Recent vital signs:  Vitals:   09/01/16 0810 09/01/16 0945  BP: (!) 98/56 101/60  Pulse: 73   Resp:    Temp: 98.2 F (36.8 C)     Recent laboratory studies:  Lab Results  Component Value Date   HGB 8.0 (L) 09/01/2016  HGB 7.9 (L) 08/31/2016   HGB 9.0 (L) 08/30/2016   Lab Results  Component Value Date   WBC 5.5 09/01/2016   PLT 237 09/01/2016   Lab Results  Component Value Date   INR 0.92 08/29/2016   Lab Results  Component Value Date   NA 139 09/01/2016   K 3.8 09/01/2016   CL 114 (H) 09/01/2016   CO2 22 09/01/2016   BUN 6 09/01/2016   CREATININE 0.74 09/01/2016   GLUCOSE 98 09/01/2016    Discharge  Medications:   Allergies as of 09/01/2016      Reactions   Oxycodone Itching   Cefazolin Hives   Codeine Nausea Only   Moxifloxacin    Tramadol Hcl    REACTION: vomiting      Medication List    TAKE these medications   amoxicillin-clavulanate 875-125 MG tablet Commonly known as:  AUGMENTIN Take 1 tablet by mouth 2 (two) times daily. What changed:  additional instructions   busPIRone 10 MG tablet Commonly known as:  BUSPAR Take 1 tablet (10 mg total) by mouth 2 (two) times daily. What changed:  how much to take   enoxaparin 40 MG/0.4ML injection Commonly known as:  LOVENOX Inject 0.4 mLs (40 mg total) into the skin daily.   ferrous sulfate 325 (65 FE) MG tablet Take 1 tablet (325 mg total) by mouth 2 (two) times daily with a meal.   fluconazole 150 MG tablet Commonly known as:  DIFLUCAN Take one now and one in a week   fluvoxaMINE 100 MG tablet Commonly known as:  LUVOX Take 1 tablet (100 mg total) by mouth 2 (two) times daily.   fluvoxaMINE 50 MG tablet Commonly known as:  LUVOX Take 1 tablet (50 mg total) by mouth 2 (two) times daily.   HYDROmorphone 2 MG tablet Commonly known as:  DILAUDID Take 1-2 tablets (2-4 mg total) by mouth every 4 (four) hours as needed for severe pain.   Levothyroxine Sodium 50 MCG Caps Take 1 capsule (50 mcg total) by mouth daily. Take first thing in the AM 30 min before breakfast.   METRONIDAZOLE (TOPICAL) 0.75 % Lotn Apply 1 application topically 2 (two) times daily.   topiramate 200 MG tablet Commonly known as:  TOPAMAX Take 1 tablet (200 mg total) by mouth daily.   Vitamin D (Ergocalciferol) 50000 units Caps capsule Commonly known as:  DRISDOL Take 1 capsule (50,000 Units total) by mouth every Tuesday.            Durable Medical Equipment        Start     Ordered   08/29/16 1546  DME Walker rolling  Once    Question:  Patient needs a walker to treat with the following condition  Answer:  Closed left subtrochanteric  femur fracture (HCC)   08/29/16 1545   08/29/16 1546  DME 3 n 1  Once     08/29/16 1545   08/29/16 1546  DME Bedside commode  Once    Question:  Patient needs a bedside commode to treat with the following condition  Answer:  Closed left subtrochanteric femur fracture (HCC)   08/29/16 1545      Diagnostic Studies: Dg Chest 1 View  Result Date: 08/29/2016 CLINICAL DATA:  Fall while walking dog with chest pain and left leg pain, initial encounter EXAM: CHEST 1 VIEW COMPARISON:  06/27/2009 FINDINGS: The heart size and mediastinal contours are within normal limits. Both lungs are clear. The visualized skeletal structures are  unremarkable. IMPRESSION: No active disease. Electronically Signed   By: Alcide CleverMark  Lukens M.D.   On: 08/29/2016 09:03   Dg Hip Operative Unilat W Or W/o Pelvis Left  Result Date: 08/29/2016 CLINICAL DATA:  Proximal left femoral fracture EXAM: OPERATIVE LEFT HIP WITH PELVIS COMPARISON:  Film from earlier in the same day FLUOROSCOPY TIME:  Radiation Exposure Index (as provided by the fluoroscopic device): Not available If the device does not provide the exposure index: Fluoroscopy Time:  1 minutes 30 seconds Number of Acquired Images:  5 FINDINGS: Proximal left femoral rod is noted with fixation screw traversing the femoral neck. Fracture fragments are in near anatomic alignment. IMPRESSION: Status post ORIF of left femoral fracture Electronically Signed   By: Alcide CleverMark  Lukens M.D.   On: 08/29/2016 14:04   Dg Hip Unilat With Pelvis 2-3 Views Left  Result Date: 08/29/2016 CLINICAL DATA:  Fall while walking dog, initial encounter EXAM: DG HIP (WITH OR WITHOUT PELVIS) 2-3V LEFT COMPARISON:  None. FINDINGS: Pelvic ring is intact. The proximal left femur shows evidence of a comminuted intratrochanteric fracture extending into the proximal femoral shaft. The femoral head remains well seated in the acetabulum. No soft tissue abnormality is seen. IMPRESSION: Proximal left femoral fracture  Electronically Signed   By: Alcide CleverMark  Lukens M.D.   On: 08/29/2016 09:02   Dg Femur Port Min 2 Views Left  Result Date: 08/29/2016 CLINICAL DATA:  Postop study. EXAM: LEFT FEMUR PORTABLE 2 VIEWS COMPARISON:  August 29, 2016 FINDINGS: A gamma nail is seen in the left femoral neck. An intramedullary rod has been placed in the femur are across the proximal comminuted displaced femoral fracture. Two distal interlocking screws are identified. Skin staples are noted. No femoral head dislocation identified. Postoperative soft tissue gas identified. IMPRESSION: Postoperative changes in the left femur as above. Electronically Signed   By: Gerome Samavid  Williams III M.D   On: 08/29/2016 15:06   Disposition: Plan will be for discharge today to either HHPT or SNF.  Up with therapy today.  Vitamin D supplement weekly for the next 8 weeks prescribed and sent to her pharmacy.  Follow-up Information    Meriel PicaJames L Naziya Hegwood, PA-C Follow up on 09/14/2016.   Specialty:  Physician Assistant Why:  @ 1:45 pm. for Staple removal. Contact information: [address] [facility] [place] [id number]            Signed: Meriel PicaJames L Vander Kueker PA-C 09/01/2016, 2:58 PM

## 2016-09-01 NOTE — Care Management (Signed)
[facility] has accepted patient with a 40% coinsurance. Presented this option to patient verses home with outpatient PT. She states that OP PT is not an option for her. She verbalized agreement to pay the 40% coinsurance that is required.

## 2016-09-01 NOTE — Progress Notes (Signed)
Subjective: 3 Days Post-Op Procedure(s) (LRB): INTRAMEDULLARY (IM) NAIL INTERTROCHANTRIC (Left) Patient reports pain as mild.   Patient is well, and has had no acute complaints or problems Plan is to go Home versus rehabilitation after hospital stay. Negative for chest pain and shortness of breath Fever: no Gastrointestinal: Negative for nausea and vomiting  Objective: Vital signs in last 24 hours: Temp:  [98.2 F (36.8 C)-98.3 F (36.8 C)] 98.2 F (36.8 C) (01/09 0810) Pulse Rate:  [73-90] 73 (01/09 0810) BP: (98-119)/(56-65) 101/60 (01/09 0945) SpO2:  [98 %-100 %] 98 % (01/09 0810)  Intake/Output from previous day:  Intake/Output Summary (Last 24 hours) at 09/01/16 1152 Last data filed at 09/01/16 0900  Gross per 24 hour  Intake          3842.67 ml  Output              300 ml  Net          3542.67 ml    Intake/Output this shift: Total I/O In: 240 [P.O.:240] Out: -   Labs:  Recent Labs  08/30/16 0430 08/31/16 0357 09/01/16 0438  HGB 9.0* 7.9* 8.0*    Recent Labs  08/31/16 0357 09/01/16 0438  WBC 4.4 5.5  RBC 2.51* 2.53*  HCT 23.2* 23.2*  PLT 183 237    Recent Labs  08/31/16 0357 09/01/16 0438  NA 139 139  K 3.5 3.8  CL 115* 114*  CO2 21* 22  BUN 7 6  CREATININE 0.69 0.74  GLUCOSE 125* 98  CALCIUM 7.9* 8.0*   No results for input(s): LABPT, INR in the last 72 hours.   EXAM General - Patient is Alert and Oriented Extremity - Sensation intact distally Dorsiflexion/Plantar flexion intact No cellulitis present Compartment soft Dressing/Incision - clean, dry, blood tinged drainage Motor Function - intact, moving foot and toes well on exam.   Abd soft on exam, normal BS.  Past Medical History:  Diagnosis Date  . History of hypothyroidism    Off meds  . History of meningitis   . IBS (irritable bowel syndrome)   . MDD (major depressive disorder)   . Migraine    [facility]  . OCD (obsessive compulsive disorder)    . Osteomyelitis (HCC)     Assessment/Plan: 3 Days Post-Op Procedure(s) (LRB): INTRAMEDULLARY (IM) NAIL INTERTROCHANTRIC (Left) Active Problems:   Closed left subtrochanteric femur fracture (HCC)  Estimated body mass index is 20.88 kg/m as calculated from the following:   Height as of this encounter: 5\' 10"  (1.778 m).   Weight as of this encounter: 66 kg (145 lb 8 oz). Up with therapy   Hemoglobin 8.0. Acute blood loss anemia secondary to fractured femur and surgery.  Started on iron supplementation.  Stable Hg and HCT today compared to yesterday. Pt has had a BM, denies any SOB or abdominal pain. Plan will be for discharge today to either HHPT or SNF.  DVT Prophylaxis - Lovenox, Foot Pumps and TED hose Touchdown Weight-Bearing to left leg  J. Horris LatinoLance Lorelei Heikkila, PA-C Orthopaedic Surgery 09/01/2016, 11:52 AM

## 2016-09-01 NOTE — Progress Notes (Signed)
Patient walked 40 feet today and PT is recommending home health. Clinical Education officer, museum ([place]) discussed case with Engineer, agricultural and it was determined that patient can D/C home today because her insurance will not cover SNF. CSW met with patient and made her aware of above. Patient is agreeable to going home today. RN case manager aware of above. Please reconsult if future social work needs arise. CSW signing off.   [employer], LCSW [phone]

## 2016-10-13 ENCOUNTER — Ambulatory Visit: Payer: Self-pay | Admitting: Physician Assistant

## 2016-10-13 ENCOUNTER — Encounter: Payer: Self-pay | Admitting: Physician Assistant

## 2016-10-13 VITALS — BP 102/65 | HR 104 | Temp 102.0°F

## 2016-10-13 DIAGNOSIS — J1100 Influenza due to unidentified influenza virus with unspecified type of pneumonia: Secondary | ICD-10-CM

## 2016-10-13 DIAGNOSIS — R509 Fever, unspecified: Secondary | ICD-10-CM

## 2016-10-13 LAB — POCT INFLUENZA A/B
INFLUENZA A, POC: NEGATIVE
INFLUENZA B, POC: NEGATIVE

## 2016-10-13 MED ORDER — HYDROCOD POLST-CPM POLST ER 10-8 MG/5ML PO SUER: 5.0000 mL | Freq: Two times a day (BID) | ORAL | 0 refills | Status: DC | PRN

## 2016-10-13 MED ORDER — AZITHROMYCIN 250 MG PO TABS: ORAL_TABLET | ORAL | 0 refills | Status: DC

## 2016-10-13 MED ORDER — METHYLPREDNISOLONE 4 MG PO TBPK: ORAL_TABLET | ORAL | 0 refills | Status: DC

## 2016-10-13 NOTE — Progress Notes (Signed)
S: C/o runny nose and congestion with dry cough for 6 days, + fever, chills, denies cp/sob, v/d; mucus was green this am , was exposed to the flu bc a family member was sick, just doesn't understand why she's not better, chest hurts with cough Does ok with hydrocodone,ie tussionex Using otc meds: robitussin  O: PE: vitals w elevated temp and hr, nad,  perrl eomi, normocephalic, tms dull, nasal mucosa red and swollen, throat injected, neck supple no lymph, lungs c occasional  Wheezing;  cv rrr, neuro intact, flu swab neg  A:  Acute flu like illness with secondary pneumonia   P: drink fluids, continue regular meds , use otc meds of choice, return if not improving in 5 days, return earlier if worsening  Zpack, tussionex, medrol dose pack

## 2016-10-14 NOTE — Progress Notes (Signed)
Patient called and expressed that she was seen yesterday and forgot to ask Darl PikesSusan for a script for Diflucan since she was prescribed an antibiotic.  I informed Darl PikesSusan and she authorized me to call in Diflucan 150mg  #2 with no refills in to [facility] in [place].

## 2016-10-16 ENCOUNTER — Ambulatory Visit: Payer: Self-pay | Admitting: Physician Assistant

## 2016-10-16 ENCOUNTER — Encounter: Payer: Self-pay | Admitting: Physician Assistant

## 2016-10-16 VITALS — BP 102/70 | HR 71 | Temp 97.5°F

## 2016-10-16 DIAGNOSIS — J069 Acute upper respiratory infection, unspecified: Secondary | ICD-10-CM

## 2016-10-16 MED ORDER — PSEUDOEPH-BROMPHEN-DM 30-2-10 MG/5ML PO SYRP: 5.0000 mL | ORAL_SOLUTION | Freq: Four times a day (QID) | ORAL | 0 refills | Status: DC | PRN

## 2016-10-16 MED ORDER — FLUCONAZOLE 150 MG PO TABS: 150.0000 mg | ORAL_TABLET | Freq: Once | ORAL | 0 refills | Status: AC

## 2016-10-16 NOTE — Progress Notes (Signed)
   Subjective:uri recheck    Patient ID: Diane Villegas, female    DOB: 08/24/1970, 46 y.o.   MRN: [id number]  HPI Patient here for follow up of Respiratory infection earlier this week. States much improvement after starting antibiotic, Prednisone, and Tussionex. States continual productive cough.Negative except for compliant.   Review of Systems    Negative except for compliant. Objective:   Physical Exam HEENT unremarkable. Neck supple w/o adenopathy. Lungs with mid right Rales which clears with cough. Heart RRR.       Assessment & Plan:  Resolving URI. Advised to use Tussionex only at night. Prescription for Bromfed DM.

## 2016-12-11 ENCOUNTER — Ambulatory Visit: Payer: Self-pay | Admitting: Family

## 2016-12-11 ENCOUNTER — Encounter: Payer: Self-pay | Admitting: Physician Assistant

## 2016-12-11 VITALS — BP 100/70 | HR 81 | Temp 97.5°F

## 2016-12-11 DIAGNOSIS — R05 Cough: Secondary | ICD-10-CM

## 2016-12-11 DIAGNOSIS — J069 Acute upper respiratory infection, unspecified: Secondary | ICD-10-CM

## 2016-12-11 DIAGNOSIS — R059 Cough, unspecified: Secondary | ICD-10-CM

## 2016-12-11 MED ORDER — FLUCONAZOLE 150 MG PO TABS: ORAL_TABLET | ORAL | 0 refills | Status: DC

## 2016-12-11 MED ORDER — AZITHROMYCIN 250 MG PO TABS
ORAL_TABLET | ORAL | 0 refills | Status: DC
Start: 2016-12-11 — End: 2017-01-26

## 2016-12-11 MED ORDER — HYDROCOD POLST-CPM POLST ER 10-8 MG/5ML PO SUER: 5.0000 mL | Freq: Two times a day (BID) | ORAL | 0 refills | Status: DC | PRN

## 2016-12-11 MED ORDER — AZITHROMYCIN 250 MG PO TABS: ORAL_TABLET | ORAL | 0 refills | Status: DC

## 2016-12-11 NOTE — Progress Notes (Signed)
S/  Cough , chest congestion x 2 weeks, pleuritic pain R anterolat chest , sl sob, without fever , chills or body aches, working but fatigued , blowing, coughing green O/ alert pleasant not toxic, mildly ill , deep cough  VSS, ENT nasal mucosa red, swollen, neck supple heart rsr Lungs are clear, good BS  A. URI  P Rx Zpack, Supportive measures discussed. Follow up prn not improving   rx tussionex 1 tsp q12 h prn #100 cc o rf, diflucan rx

## 2017-01-21 ENCOUNTER — Ambulatory Visit: Payer: Self-pay | Admitting: Physician Assistant

## 2017-01-26 ENCOUNTER — Encounter: Payer: Self-pay | Admitting: Physician Assistant

## 2017-01-26 ENCOUNTER — Ambulatory Visit: Payer: Self-pay | Admitting: Physician Assistant

## 2017-01-26 VITALS — BP 120/75 | HR 76 | Temp 97.9°F

## 2017-01-26 DIAGNOSIS — Z0000 Encounter for general adult medical examination without abnormal findings: Secondary | ICD-10-CM

## 2017-01-26 DIAGNOSIS — F331 Major depressive disorder, recurrent, moderate: Secondary | ICD-10-CM

## 2017-01-26 DIAGNOSIS — F4310 Post-traumatic stress disorder, unspecified: Secondary | ICD-10-CM

## 2017-01-26 MED ORDER — AZITHROMYCIN 250 MG PO TABS: ORAL_TABLET | ORAL | 0 refills | Status: DC

## 2017-01-26 MED ORDER — FLUVOXAMINE MALEATE 100 MG PO TABS: 300.0000 mg | ORAL_TABLET | Freq: Every day | ORAL | 0 refills | Status: AC

## 2017-01-26 MED ORDER — TOPIRAMATE 200 MG PO TABS: 200.0000 mg | ORAL_TABLET | Freq: Every day | ORAL | 0 refills | Status: AC

## 2017-01-26 NOTE — Progress Notes (Signed)
S: pt here for wellness physical had biometrics for insurance purposes, states she is running out of her depression meds and can't see her doctor until next week, also ?if could get her mammogram done here since she doesn't like to take a lot of time off of work, no other complaints ros neg. PMH:  Depression, ocd, ptsd  Social: smoker, 8-9 cig qd, occ etoh, no drugs  ZOX:WRUEAVFam:father died of lymphoma, mother died of colon ca, had dm, both had chf; 2 brothers are healthy, 1 sister has depression  O: vitals wnl, nad, ENT wnl, neck supple no lymph, lungs c t a, cv rrr, abd soft nontender bs normal all 4 quads  A: wellness exam  P: mammogram ordered, refill on topomax and luvox x 1 month, f/u with pcp for annual physical

## 2017-02-16 ENCOUNTER — Emergency Department
Admission: EM | Admit: 2017-02-16 | Discharge: 2017-02-16 | Disposition: A | Discharge status: Home / Self Care | Payer: Managed Care, Other (non HMO) | Source: Home / Self Care | Attending: Family Medicine | Admitting: Family Medicine

## 2017-02-16 ENCOUNTER — Encounter: Payer: Self-pay | Admitting: *Deleted

## 2017-02-16 DIAGNOSIS — R1115 Cyclical vomiting syndrome unrelated to migraine: Secondary | ICD-10-CM

## 2017-02-16 DIAGNOSIS — R519 Headache, unspecified: Secondary | ICD-10-CM

## 2017-02-16 DIAGNOSIS — R197 Diarrhea, unspecified: Secondary | ICD-10-CM | POA: Diagnosis not present

## 2017-02-16 DIAGNOSIS — R112 Nausea with vomiting, unspecified: Secondary | ICD-10-CM

## 2017-02-16 DIAGNOSIS — R51 Headache: Secondary | ICD-10-CM

## 2017-02-16 LAB — POCT CBC W AUTO DIFF (K'VILLE URGENT CARE)

## 2017-02-16 MED ORDER — ONDANSETRON 4 MG PO TBDP: 4.0000 mg | ORAL_TABLET | Freq: Three times a day (TID) | ORAL | 0 refills | Status: DC | PRN

## 2017-02-16 MED ORDER — ONDANSETRON 4 MG PO TBDP
4.0000 mg | ORAL_TABLET | Freq: Once | ORAL | Status: AC
  Administered 2017-02-16: 4 mg via ORAL

## 2017-02-16 MED ORDER — SODIUM CHLORIDE 0.9 % IV BOLUS (SEPSIS)
1000.0000 mL | Freq: Once | INTRAVENOUS | Status: AC
  Administered 2017-02-16: 1000 mL via INTRAVENOUS

## 2017-02-16 MED ORDER — PROMETHAZINE HCL 25 MG/ML IJ SOLN
25.0000 mg | Freq: Once | INTRAMUSCULAR | Status: AC
  Administered 2017-02-16: 25 mg via INTRAVENOUS

## 2017-02-16 MED ORDER — METRONIDAZOLE 500 MG PO TABS: 500.0000 mg | ORAL_TABLET | Freq: Two times a day (BID) | ORAL | 0 refills | Status: DC

## 2017-02-16 NOTE — ED Provider Notes (Signed)
CSN: [id number]     Arrival date & time 02/16/17  1544 History   First MD Initiated Contact with Patient 02/16/17 1604     Chief Complaint  Patient presents with  . Emesis  . Diarrhea   (Consider location/radiation/quality/duration/timing/severity/associated sxs/prior Treatment) HPI  Diane Villegas is a 46 y.o. female presenting to UC with c/o n/v/d with belching that has a foul odor since yesterday.  Symptoms started with the belching.  She has since had about 15 episodes of vomiting and diarrhea each this morning.  Pt notes the diarrhea is watery w/o blood or mucous but is "large amounts" despite not being able to keep down any water or food today.  She did try OTC anti-acid medication to help with the belching but no relief.  No fever, chills, n/v/d. Friend with pt recalls she was handling raw hamburger meat the night before symptoms started.  She has had her gallbladder removed and a hysterectomy. No recent travel. No known sick contacts.    Past Medical History:  Diagnosis Date  . History of hypothyroidism    Off meds  . History of meningitis   . IBS (irritable bowel syndrome)   . MDD (major depressive disorder)   . Migraine    [facility]  . OCD (obsessive compulsive disorder)   . Osteomyelitis [facility](HCC)    Past Surgical History:  Procedure Laterality Date  . BTL  2004  . CHOLECYSTECTOMY    . INTRAMEDULLARY (IM) NAIL INTERTROCHANTERIC Left 08/29/2016   Procedure: INTRAMEDULLARY (IM) NAIL INTERTROCHANTRIC;  Surgeon: Christena FlakeJohn J Poggi, MD;  Location: [facility] ORS;  Service: Orthopedics;  Laterality: Left;  . PARTIAL HYSTERECTOMY  2007   For prolapse  . THYROIDECTOMY, PARTIAL  1999  . TONSILLECTOMY     Family History  Problem Relation Age of Onset  . Lymphoma Father   . Dementia Father   . Drug abuse Father   . Alcohol abuse Father   . Diabetes Mother   . Colon cancer Mother   . Arrhythmia Mother        Palpitations  . Depression Mother   . Anxiety disorder  Mother   . Arrhythmia Sister        Unknown type  . Depression Sister   . Anxiety disorder Sister   . Colon cancer Maternal Grandmother 60  . Alcohol abuse Brother   . Bipolar disorder Maternal Aunt   . Paranoid behavior Maternal Aunt   . Drug abuse Paternal Uncle   . Alcohol abuse Paternal Uncle   . Drug abuse Maternal Grandfather   . Alcohol abuse Maternal Grandfather   . Dementia Paternal Grandfather   . Dementia Paternal Grandmother    Social History  Substance Use Topics  . Smoking status: Current Every Day Smoker    Packs/day: 0.50    Years: 11.00    Types: Cigarettes  . Smokeless tobacco: Never Used  . Alcohol use 0.5 oz/week    1 Standard drinks or equivalent per week   OB History    No data available     Review of Systems  Constitutional: Positive for fatigue. Negative for chills and fever.  Gastrointestinal: Positive for abdominal pain (abdominal cramping), diarrhea, nausea and vomiting.  Genitourinary: Negative for dysuria, flank pain, frequency, pelvic pain and urgency.  Neurological: Positive for weakness ( generalized) and headaches. Negative for dizziness, syncope and numbness.    Allergies  Oxycodone; Cefazolin; Codeine; Moxifloxacin; and Tramadol hcl  Home Medications  Prior to Admission medications   Medication Sig Start Date End Date Taking? Authorizing Provider  busPIRone (BUSPAR) 10 MG tablet Take 1 tablet (10 mg total) by mouth 2 (two) times daily. Patient not taking: Reported on 10/13/2016 10/31/15   Thresa Ross, MD  fluvoxaMINE (LUVOX) 100 MG tablet Take 3 tablets (300 mg total) by mouth at bedtime. 01/26/17   Sherrie Mustache Roselyn Bering, PA-C  Levothyroxine Sodium 50 MCG CAPS Take 1 capsule (50 mcg total) by mouth daily. Take first thing in the AM 30 min before breakfast. 03/22/12   Breeback, Jade L, PA-C  metroNIDAZOLE (FLAGYL) 500 MG tablet Take 1 tablet (500 mg total) by mouth 2 (two) times daily. 02/16/17   Lurene Shadow, PA-C  ondansetron (ZOFRAN ODT)  4 MG disintegrating tablet Take 1 tablet (4 mg total) by mouth every 8 (eight) hours as needed for nausea or vomiting. 02/16/17   Lurene Shadow, PA-C  topiramate (TOPAMAX) 200 MG tablet Take 1 tablet (200 mg total) by mouth daily. 01/26/17   Faythe Ghee, PA-C   Meds Ordered and Administered this Visit   Medications  ondansetron (ZOFRAN-ODT) disintegrating tablet 4 mg (4 mg Oral Given 02/16/17 1607)  sodium chloride 0.9 % bolus 1,000 mL (0 mLs Intravenous Stopped 02/16/17 1748)  promethazine (PHENERGAN) injection 25 mg (25 mg Intravenous Given 02/16/17 1649)    BP 110/70 (BP Location: Left Arm)   Pulse 86   Temp 97.5 F (36.4 C) (Oral)   Resp 16   Wt 145 lb (65.8 kg)   SpO2 100%   BMI 20.81 kg/m  No data found.   Physical Exam  Constitutional: She is oriented to person, place, and time. She appears well-developed and well-nourished.  Pt lying on exam bed curled in a blanket, appears mildly fatigued and acutely ill but is alert and cooperative  HENT:  Head: Normocephalic and atraumatic.  Mouth/Throat: Oropharynx is clear and moist.  Eyes: EOM are normal.  Neck: Normal range of motion. Neck supple.  Cardiovascular: Normal rate and regular rhythm.   Pulmonary/Chest: Effort normal and breath sounds normal. No stridor. No respiratory distress. She has no wheezes. She has no rales.  Abdominal: Soft. She exhibits no distension and no mass. There is no tenderness. There is no rebound and no guarding.  Musculoskeletal: Normal range of motion.  Lymphadenopathy:    She has no cervical adenopathy.  Neurological: She is alert and oriented to person, place, and time.  Skin: Skin is warm and dry. She is not diaphoretic.  Psychiatric: She has a normal mood and affect. Her behavior is normal.  Nursing note and vitals reviewed.   Urgent Care Course     Procedures (including critical care time)  Labs Review Labs Reviewed  COMPLETE METABOLIC PANEL WITH GFR  GASTROINTESTINAL PATHOGEN  PANEL PCR  POCT CBC W AUTO DIFF ([facility])    Imaging Review No results found.    MDM   1. Nausea vomiting and diarrhea   2. Intractable cyclical vomiting with nausea   3. Generalized headache    Pt c/o 2 days of n/v/d, generalized HA Vitals: WNL  Benign abdominal exam.  CBC: unremarkable CMP: pending GI PCR lab ordered. Pt unable to provide sample in UC despite stating she had 2 episodes while in waiting room.  Zofran 4mg  ODT- pt still nauseated  1L IV fluids and phenergan 25mg  IV  Pt stated she was feeling better. Allowed to sip on ice chips, then ginger ale. Pt tried crackers  then vomited 2 more times.  Recommend pt go to ED for further evaluation and continued treatment. Pt agreeable with plan. Pt going to [facility] POV via friend.     Lurene Shadow, PA-C 02/16/17 1810    Lurene Shadow, PA-C 02/16/17 1821

## 2017-02-16 NOTE — ED Triage Notes (Signed)
Patient c/o vomiting, diarrhea and excessive belching with foul odor since yesterday. She also c/o HA. She is unable to keep food or any fluids down.

## 2017-02-16 NOTE — Discharge Instructions (Signed)
° °  If you have additional watery stool, please bring a sample back for testing in the lab.  Do not start the antibiotic- Flagyl (metronidazole) until AFTER you provide the stool sample as this may skew the lab results if already taking the antibiotic before giving your sample.

## 2017-02-17 ENCOUNTER — Telehealth: Payer: Self-pay | Admitting: Emergency Medicine

## 2017-02-17 ENCOUNTER — Telehealth: Payer: Self-pay | Admitting: *Deleted

## 2017-02-17 LAB — COMPLETE METABOLIC PANEL WITH GFR
ALT: 10 U/L (ref 6–29)
AST: 13 U/L (ref 10–35)
Albumin: 4.3 g/dL (ref 3.6–5.1)
Alkaline Phosphatase: 54 U/L (ref 33–115)
BUN: 17 mg/dL (ref 7–25)
CO2: 20 mmol/L (ref 20–31)
Calcium: 8.9 mg/dL (ref 8.6–10.2)
Chloride: 110 mmol/L (ref 98–110)
Creat: 0.91 mg/dL (ref 0.50–1.10)
GFR, Est African American: 87 mL/min (ref >=60)
GFR, Est Non African American: 75 mL/min (ref >=60)
Glucose, Bld: 89 mg/dL (ref 65–99)
Potassium: 3.8 mmol/L (ref 3.5–5.3)
Sodium: 139 mmol/L (ref 135–146)
Total Bilirubin: 0.7 mg/dL (ref 0.2–1.2)
Total Protein: 6.7 g/dL (ref 6.1–8.1)

## 2017-02-17 NOTE — Telephone Encounter (Signed)
Encounter created to enter CBC Order and result not entered on DOS.   

## 2017-02-21 ENCOUNTER — Other Ambulatory Visit: Payer: Self-pay | Admitting: Physician Assistant

## 2017-02-21 DIAGNOSIS — F4310 Post-traumatic stress disorder, unspecified: Secondary | ICD-10-CM

## 2017-02-21 DIAGNOSIS — F331 Major depressive disorder, recurrent, moderate: Secondary | ICD-10-CM

## 2017-04-22 ENCOUNTER — Other Ambulatory Visit: Payer: Self-pay | Admitting: Student

## 2017-04-22 DIAGNOSIS — S76012A Strain of muscle, fascia and tendon of left hip, initial encounter: Secondary | ICD-10-CM

## 2017-05-04 ENCOUNTER — Ambulatory Visit
Admission: RE | Admit: 2017-05-04 | Discharge: 2017-05-04 | Disposition: A | Discharge status: Home / Self Care | Payer: Managed Care, Other (non HMO) | Source: Ambulatory Visit | Attending: Student | Admitting: Student

## 2017-05-04 ENCOUNTER — Other Ambulatory Visit: Payer: Self-pay | Admitting: Student

## 2017-05-04 DIAGNOSIS — S76012A Strain of muscle, fascia and tendon of left hip, initial encounter: Secondary | ICD-10-CM | POA: Diagnosis present

## 2017-05-04 DIAGNOSIS — X58XXXA Exposure to other specified factors, initial encounter: Secondary | ICD-10-CM | POA: Diagnosis not present

## 2017-05-04 MED ORDER — LIDOCAINE HCL (PF) 1 % IJ SOLN
5.0000 mL | Freq: Once | INTRAMUSCULAR | Status: AC
  Administered 2017-05-04: 5 mL
  Filled 2017-05-04: qty 5

## 2017-05-04 MED ORDER — IOPAMIDOL (ISOVUE-200) INJECTION 41%
10.0000 mL | Freq: Once | INTRAVENOUS | Status: AC | PRN
  Administered 2017-05-04: 10 mL via INTRAVENOUS
  Filled 2017-05-04: qty 50

## 2017-05-04 MED ORDER — GADOBENATE DIMEGLUMINE 529 MG/ML IV SOLN
0.1000 mL | Freq: Once | INTRAVENOUS | Status: AC | PRN
  Administered 2017-05-04: 0.1 mL via INTRA_ARTICULAR

## 2017-05-04 MED ORDER — SODIUM CHLORIDE 0.9 % IJ SOLN
20.0000 mL | INTRAMUSCULAR | Status: DC | PRN
  Administered 2017-05-04: 20 mL
  Filled 2017-05-04: qty 20

## 2017-12-02 ENCOUNTER — Ambulatory Visit: Payer: Self-pay | Admitting: Family Medicine

## 2017-12-02 ENCOUNTER — Ambulatory Visit
Admission: RE | Admit: 2017-12-02 | Discharge: 2017-12-02 | Disposition: A | Discharge status: Home / Self Care | Payer: Managed Care, Other (non HMO) | Source: Ambulatory Visit | Attending: Family Medicine | Admitting: Family Medicine

## 2017-12-02 VITALS — BP 122/75 | HR 66 | Resp 16

## 2017-12-02 DIAGNOSIS — L719 Rosacea, unspecified: Secondary | ICD-10-CM

## 2017-12-02 DIAGNOSIS — M549 Dorsalgia, unspecified: Secondary | ICD-10-CM | POA: Insufficient documentation

## 2017-12-02 DIAGNOSIS — M47812 Spondylosis without myelopathy or radiculopathy, cervical region: Secondary | ICD-10-CM | POA: Insufficient documentation

## 2017-12-02 MED ORDER — METHOCARBAMOL 750 MG PO TABS: 750.0000 mg | ORAL_TABLET | Freq: Three times a day (TID) | ORAL | 0 refills | Status: AC | PRN

## 2017-12-02 MED ORDER — METRONIDAZOLE 0.75 % EX LOTN: TOPICAL_LOTION | CUTANEOUS | 0 refills | Status: AC

## 2017-12-02 NOTE — Progress Notes (Signed)
Subjective: back pain     Diane Villegas is a 47 y.o. female who presents for evaluation of upper back pain. The patient has had no prior upper back problems but has had a history of low back pain in the past. Symptoms have been present for 6 days and are unchanged.  Onset was related to / precipitated by no known injury.  Denies any recent or previous trauma or surgery.  The pain is located in the left lower posterior neck, left upper thoracic paraspinal mucles and left upper trapezius muscle. The pain is described as tightness and occurs all day. Symptoms are exacerbated by movement. Factors which relieve the pain include heat.  The symptoms have not been progressive.She has also tried stretching which provided no significant symptom relief. She has no other symptoms associated with the back pain. Previous history of symptoms: never.  Patient states "it it feels like a pulled muscle in my back". History of cancer: Negative. Patient reports a history of osteopenia and 2 previous pathologic fractures.  Left sub-trochanteric fracture sustained while walking a dog and fractured one of the bones in her lower leg (she is unsure which) when getting out of a car.  Denies any history of vertebral fractures.  The patient has no "red flag" history indicative of complicated back pain.  Patient also requesting refill for her topical metronidazole for rosacea.  Patient is in the process of finding a dermatologist and needs one refill in the meantime.  She has been taking this medication for over a year and tolerates it well without any side/adverse effects.  Review of Systems Pertinent items noted in HPI and remainder of comprehensive ROS otherwise negative.    Objective:   Full range of motion. Pain with movement of her left upper back.  Paraspinal muscles and left upper trapezius TTP with muscle spasm noted.  Normal back alignment. Normal reflexes, gait, and strength.  General appearance: alert, cooperative,  appears stated age and no distress Extremities: extremities normal, atraumatic, no cyanosis or edema Pulses: 2+ and symmetric Skin: Skin color, texture, turgor normal. No rashes or lesions Neurologic: Grossly normal  Diagnostic Results: C-spine and thoracic x-ray:  THORACIC SPINE - 3 VIEWS  COMPARISON:  Chest radiograph, 06/27/2009  FINDINGS: No fracture.  No bone lesion.  No spondylolisthesis.  Minimal endplate spurring along the midthoracic spine. No other degenerative change.  Soft tissues are unremarkable.  IMPRESSION: 1. No fracture, spondylolisthesis or acute finding. 2. Minimal disc degenerative change in the midthoracic spine.   Electronically Signed   By: Amie Portlandavid  Ormond M.D.   On: 12/02/2017 09:13  CERVICAL SPINE - COMPLETE 4+ VIEW  COMPARISON:  None.  FINDINGS: No fracture or bone lesion. No spondylolisthesis. Mild reversal of the normal cervical lordosis which appears due to neck flexion.  Mild loss of disc height with endplate spurring at C5-C6. Remaining disc spaces are well preserved. Neural foramina are widely patent.  There are surgical vascular clips anteriorly reflecting prior thyroid surgery.  IMPRESSION: 1. No fracture, spondylolisthesis or acute finding. 2. Mild disc degenerative changes at C5-C6.   Electronically Signed   By: Amie Portlandavid  Ormond M.D.   On: 12/02/2017 09:12  Assessment:   Acute upper back pain    Plan:    Natural history and expected course discussed. Questions answered. Stretching exercises discussed. Regular aerobic and trunk strengthening exercises discussed. Short (2-4 day) period of relative rest recommended until acute symptoms improve. Heat to affected area as needed for local pain relief. Muscle  relaxants per medication orders.   Discuss taking OTC NSAIDs and patient said that she is not supposed to due to the increased risk of rebound headaches. Follow-up with primary care provider. Provided  patient with a [employer] heat wrap for her neck and advised her to apply heat multiple times a day to the area. Advised patient of the potential for increased sedation taking methocarbamol and topiramate concurrently.  Advised her that if she experiences this to stop taking methocarbamol and inform me immediately.  Patient reports she has taken muscle relaxants in the past with topiramate and tolerated this well without sedation.  Discussed side/adverse effects of medications prescribed and informed her not to drive, operate heavy machinery, drink alcohol, or take any other CNS depressants while on methocarbamol.  Patient verbally agreed.  Provided 1 refill for her topical metronidazole for rosacea. Red flag symptoms and indications to seek medical care discussed.

## 2018-04-06 IMAGING — MR MR HIP*L* W/CM
6 series · 40 of 40 positions shown · IV contrast (agent unspecified)
Comparison: None.

CLINICAL DATA: Status post fall. Left hip pain. Prior hip surgery
August 2016.

EXAM:
MRI OF THE LEFT HIP WITH CONTRAST(MR Arthrogram)
TECHNIQUE: Multiplanar, multisequence MR imaging of the hip was performed
immediately following contrast injection into the hip joint under
fluoroscopic guidance. No intravenous contrast was administered.

[Series 2: STIR · coronal · 4.0mm · 1.25mm/px · 8 of 30 slices shown]
[im 1/30]
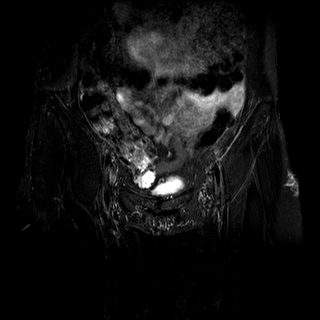
[im 5/30]
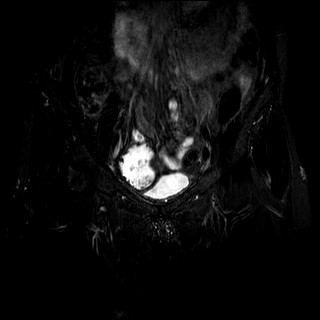
[im 9/30]
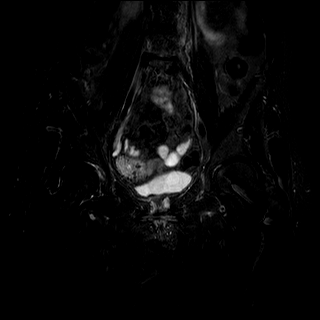
[im 13/30]
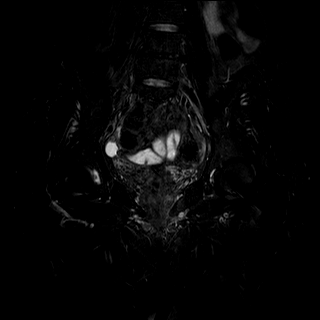
[im 17/30]
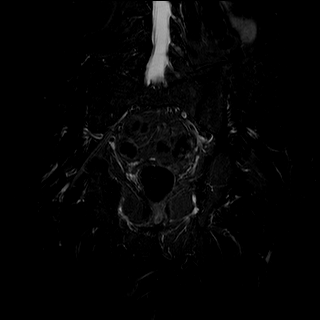
[im 21/30]
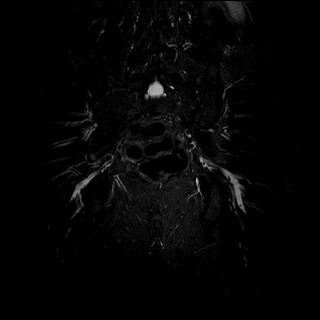
[im 25/30]
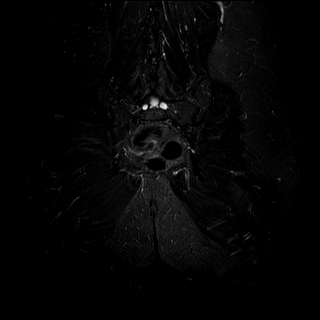
[im 30/30]
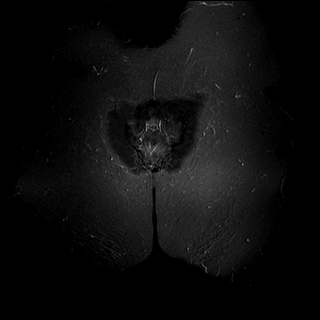

[Series 3: T1 fat-sat · axial · 4.0mm · 0.56mm/px · z∈[-187,-10]mm · 8 of 38 slices shown (1 of 3)]
[im 1/38]
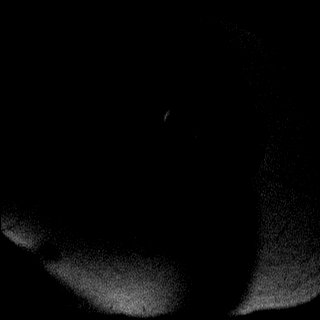
[im 6/38]
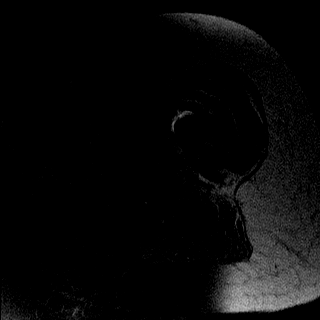
[im 11/38]
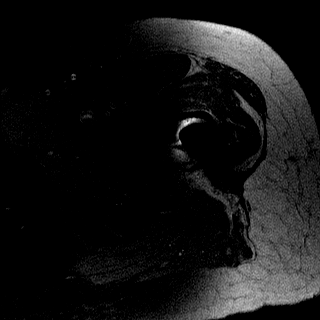
[im 16/38]
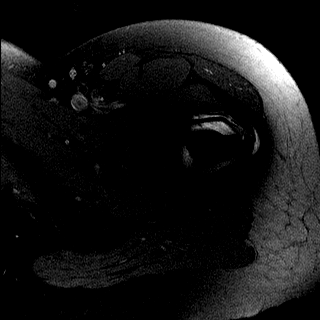
[im 22/38]
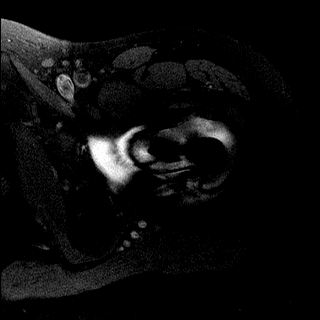
[im 27/38]
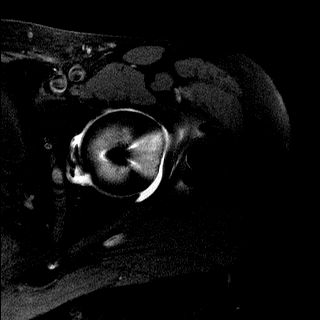
[im 32/38]
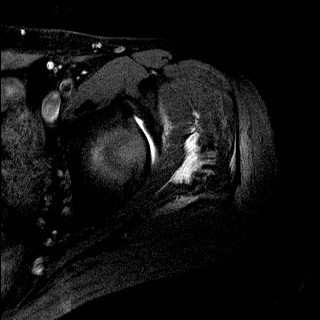
[im 38/38]
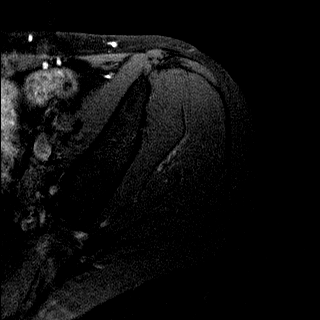

[Series 4: T1 · coronal · 4.0mm · 1.25mm/px · 6 of 28 slices shown]
[im 1/28]
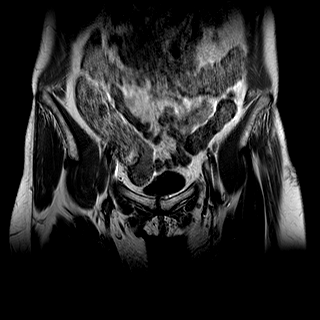
[im 6/28]
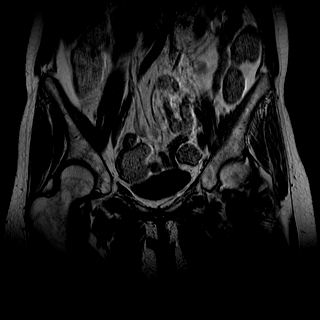
[im 11/28]
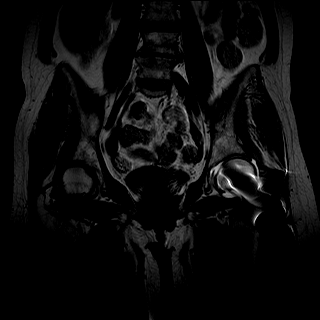
[im 17/28]
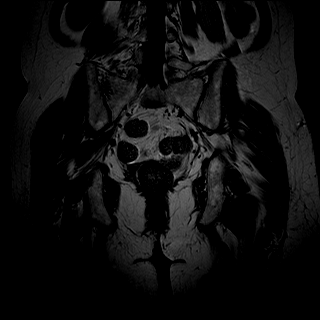
[im 22/28]
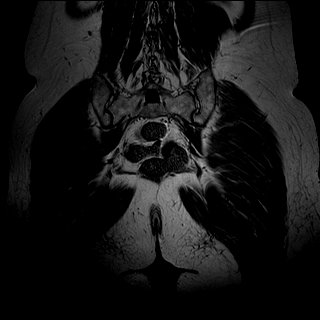
[im 28/28]
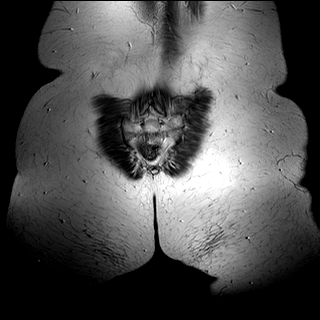

[Series 5: T2 fat-sat · coronal · 4.0mm · 1.25mm/px · 6 of 28 slices shown]
[im 1/28]
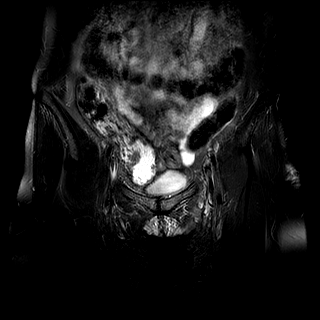
[im 6/28]
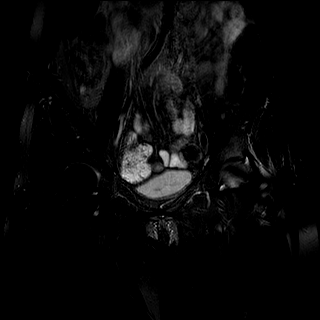
[im 11/28]
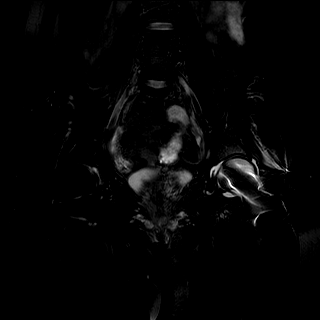
[im 17/28]
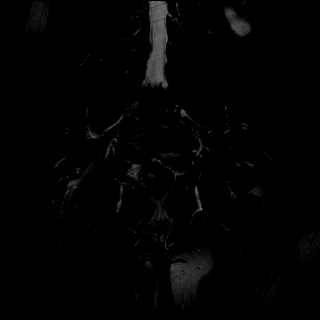
[im 22/28]
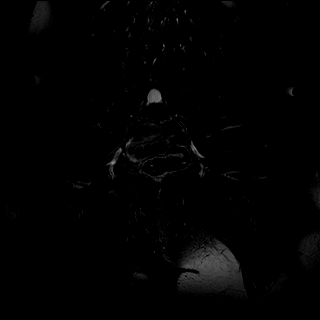
[im 28/28]
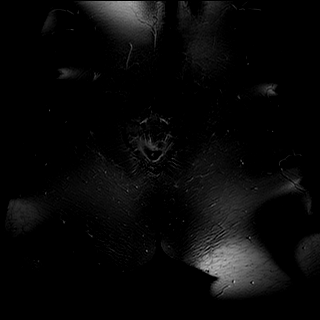

[Series 6: T1 fat-sat · coronal · 4.0mm · 0.56mm/px · 6 of 28 slices shown (2 of 3)]
[im 1/28]
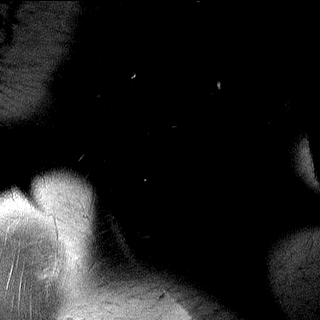
[im 6/28]
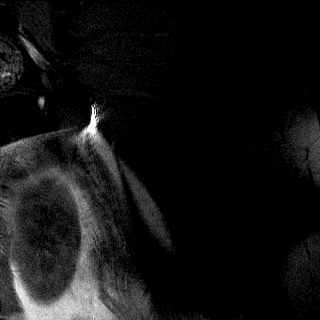
[im 11/28]
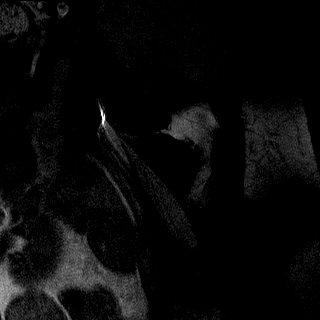
[im 17/28]
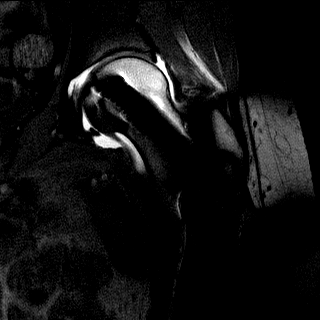
[im 22/28]
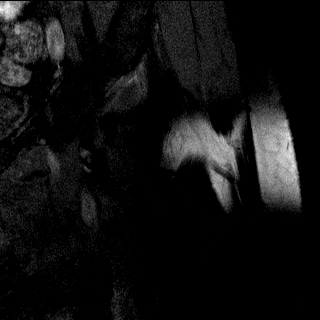
[im 28/28]
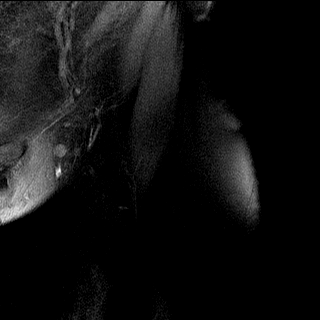

[Series 7: T1 fat-sat · sagittal · 4.0mm · 0.56mm/px · 6 of 28 slices shown (3 of 3)]
[im 1/28]
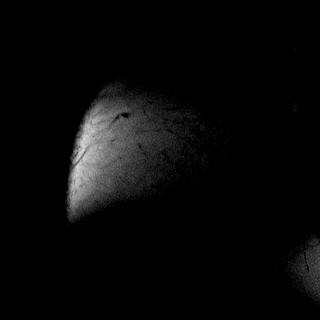
[im 6/28]
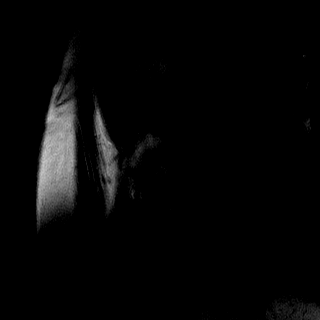
[im 11/28]
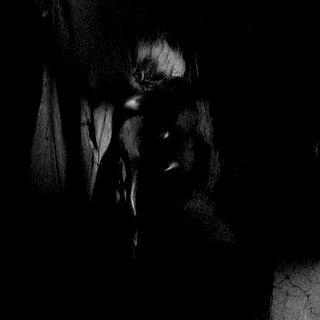
[im 17/28]
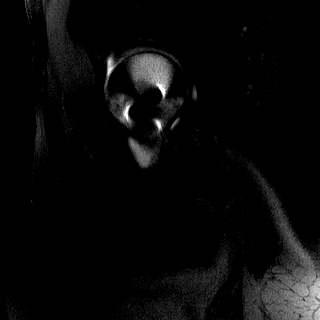
[im 22/28]
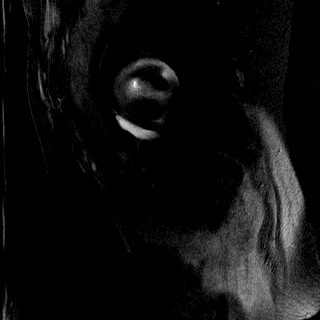
[im 28/28]
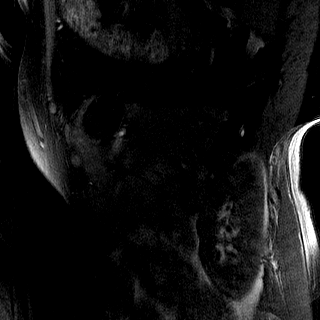

[40 of 40 positions shown; findings below may reference images not displayed]

FINDINGS: Bone

No marrow signal abnormality. No fracture or dislocation. Prior ORIF
of a left femoral fracture transfixed with a intramedullary nail and
interlocking femoral neck screw with resulting susceptibility
artifact partially obscuring the adjacent soft tissue and osseous
structures.

Normal sacroiliac joints.  No SI joint widening or erosive changes.

Degenerative disc disease with severe disc height loss at L4-5.

Alignment

Normal. No subluxation.

Dysplasia

None.

Joint effusion

Intraarticular contrast distending the left hip joint capsule. No
right hip joint effusion. No SI joint effusion.

Labrum

Left labrum is intact.

Cartilage

Femoral cartilage: Normal.

Acetabular cartilage: Normal.

Capsule and ligaments

Normal.

Muscles and Tendons

Flexors: Normal.

Extensors: Normal.

Abductors: Normal.

Adductors: Normal.

Rotators: Normal.

Hamstrings: Normal.

Other Findings

None

Viscera

Normal. No abnormality seen in pelvis. No lymphadenopathy. No free
fluid in the pelvis.
IMPRESSION: 1. No MRI findings to explain the patient's left hip pain.
2. Left acetabular labrum is intact.

## 2020-06-22 ENCOUNTER — Emergency Department (INDEPENDENT_AMBULATORY_CARE_PROVIDER_SITE_OTHER)
Admission: EM | Admit: 2020-06-22 | Discharge: 2020-06-22 | Disposition: A | Discharge status: Home / Self Care | Payer: 59 | Source: Home / Self Care

## 2020-06-22 ENCOUNTER — Ambulatory Visit: Payer: Self-pay

## 2020-06-22 ENCOUNTER — Emergency Department
Admission: EM | Admit: 2020-06-22 | Discharge: 2020-06-22 | Discharge status: Absent without leave | Payer: Self-pay | Source: Home / Self Care

## 2020-06-22 ENCOUNTER — Other Ambulatory Visit: Payer: Self-pay

## 2020-06-22 DIAGNOSIS — R3129 Other microscopic hematuria: Secondary | ICD-10-CM | POA: Diagnosis not present

## 2020-06-22 DIAGNOSIS — Z87442 Personal history of urinary calculi: Secondary | ICD-10-CM

## 2020-06-22 DIAGNOSIS — R1031 Right lower quadrant pain: Secondary | ICD-10-CM

## 2020-06-22 DIAGNOSIS — R109 Unspecified abdominal pain: Secondary | ICD-10-CM | POA: Insufficient documentation

## 2020-06-22 LAB — POCT URINALYSIS DIP (MANUAL ENTRY)
Bilirubin, UA: NEGATIVE
Glucose, UA: NEGATIVE mg/dL
Ketones, POC UA: NEGATIVE mg/dL
Leukocytes, UA: NEGATIVE
Nitrite, UA: NEGATIVE
Protein Ur, POC: NEGATIVE mg/dL
Spec Grav, UA: 1.02 (ref 1.010–1.025)
Urobilinogen, UA: 1 E.U./dL
pH, UA: 7 (ref 5.0–8.0)

## 2020-06-22 MED ORDER — TAMSULOSIN HCL 0.4 MG PO CAPS: 0.4000 mg | ORAL_CAPSULE | Freq: Every day | ORAL | 0 refills | Status: DC

## 2020-06-22 NOTE — ED Triage Notes (Signed)
Pt states that she has some urinary frequency and urinary urgency.

## 2020-06-22 NOTE — Discharge Instructions (Signed)
  You may take the medication as prescribed to help with urinary symptoms. Be sure to stay well hydrated. You may take 500mg  acetaminophen every 4-6 hours or in combination with ibuprofen 400-600mg  every 6-8 hours as needed for pain and inflammation.  Call to schedule a follow up appointment with primary care and/or urology next week for recheck of symptoms.   Call 911 or have someone drive you to the hospital if symptoms significantly worsening including worsening pain, unable to urinate, vomiting, fever, or other new concerning symptoms develop.

## 2020-06-22 NOTE — ED Provider Notes (Addendum)
[facility]    CSN: [id number] Arrival date & time: 06/22/20  1425      History   Chief Complaint Chief Complaint  Patient presents with  . Abdominal Pain    HPI Diane Villegas is a 49 y.o. female.   HPI Diane Villegas is a 49 y.o. female presenting to UC with c/o urinary frequency and urgency that started yesterday with associated Right side flank pain radiating into Right lower abdomen and pelvis. Hx of first kidney stone earlier in the year. Symptoms feel similar, waxing and waning sharp pain.  Decreased urine output this morning despite increasing her water intake. Denies fever. Mild nausea from not sleeping well last night due to the pain. Denies vomiting or diarrhea.    Past Medical History:  Diagnosis Date  . History of hypothyroidism    Off meds  . History of meningitis   . IBS (irritable bowel syndrome)   . MDD (major depressive disorder)   . Migraine    Dr. Catalina Lunger at the [facility]  . OCD (obsessive compulsive disorder)   . Osteomyelitis [facility])     Patient Active Problem List   Diagnosis Date Noted  . Abdominal pain 06/22/2020  . Closed left subtrochanteric femur fracture (HCC) 08/29/2016  . Calculus of bile duct 04/23/2015  . Gastro-esophageal reflux disease without esophagitis 09/04/2014  . MDD (major depressive disorder)   . MDD (major depressive disorder), recurrent episode, moderate (HCC) 03/14/2014  . Inflammatory disorder of extremity 11/02/2013  . LUMBAR RADICULOPATHY, RIGHT 04/08/2010  . SCIATICA 03/21/2010  . CIGARETTE SMOKER 06/27/2009  . BACK PAIN, THORACIC REGION 04/01/2009  . HYPOTHYROIDISM 11/19/2008  . PREMATURE VENTRICULAR CONTRACTIONS 11/19/2008  . OCD (obsessive compulsive disorder) 06/19/2008  . MIGRAINE HEADACHE 06/19/2008    Past Surgical History:  Procedure Laterality Date  . BTL  2004  . CHOLECYSTECTOMY    . INTRAMEDULLARY (IM) NAIL INTERTROCHANTERIC Left 08/29/2016   Procedure: INTRAMEDULLARY (IM) NAIL  INTERTROCHANTRIC;  Surgeon: Christena Flake, MD;  Location: [facility] ORS;  Service: Orthopedics;  Laterality: Left;  . PARTIAL HYSTERECTOMY  2007   For prolapse  . THYROIDECTOMY, PARTIAL  1999  . TONSILLECTOMY      OB History   No obstetric history on file.      Home Medications    Prior to Admission medications   Medication Sig Start Date End Date Taking? Authorizing Provider  fluvoxaMINE (LUVOX) 100 MG tablet Take 3 tablets (300 mg total) by mouth at bedtime. 01/26/17  Yes Fisher, Roselyn Bering, PA-C  topiramate (TOPAMAX) 200 MG tablet Take 1 tablet (200 mg total) by mouth daily. 01/26/17  Yes Fisher, Roselyn Bering, PA-C  busPIRone (BUSPAR) 10 MG tablet Take 1 tablet (10 mg total) by mouth 2 (two) times daily. 10/31/15   Thresa Ross, MD  methocarbamol (ROBAXIN) 750 MG tablet Take 1 tablet (750 mg total) by mouth every 8 (eight) hours as needed for muscle spasms. 12/02/17   McManama, Richardson Dopp, FNP  METRONIDAZOLE, TOPICAL, 0.75 % LOTN Apply 1 application topically 2 (two) times daily. 12/02/17   McManama, Richardson Dopp, FNP  tamsulosin (FLOMAX) 0.4 MG CAPS capsule Take 1 capsule (0.4 mg total) by mouth daily. 06/22/20   Lurene Shadow, PA-C    Family History Family History  Problem Relation Age of Onset  . Lymphoma Father   . Dementia Father   . Drug abuse Father   . Alcohol abuse Father   . Diabetes Mother   . Colon cancer  Mother   . Arrhythmia Mother        Palpitations  . Depression Mother   . Anxiety disorder Mother   . Arrhythmia Sister        Unknown type  . Depression Sister   . Anxiety disorder Sister   . Colon cancer Maternal Grandmother 60  . Alcohol abuse Brother   . Bipolar disorder Maternal Aunt   . Paranoid behavior Maternal Aunt   . Drug abuse Paternal Uncle   . Alcohol abuse Paternal Uncle   . Drug abuse Maternal Grandfather   . Alcohol abuse Maternal Grandfather   . Dementia Paternal Grandfather   . Dementia Paternal Grandmother     Social History Social History    Tobacco Use  . Smoking status: Current Every Day Smoker    Packs/day: 0.50    Years: 11.00    Pack years: 5.50    Types: Cigarettes  . Smokeless tobacco: Never Used  Substance Use Topics  . Alcohol use: Not Currently    Alcohol/week: 1.0 standard drink    Types: 1 Standard drinks or equivalent per week  . Drug use: No    Types: Cocaine, Marijuana, MDMA (Ecstacy)    Comment: Last use in her 84s     Allergies   Oxycodone, Cefazolin, Codeine, Moxifloxacin, and Tramadol hcl   Review of Systems Review of Systems  Constitutional: Negative for chills and fever.  Gastrointestinal: Positive for abdominal pain and nausea. Negative for diarrhea and vomiting.  Genitourinary: Positive for decreased urine volume, dysuria, flank pain, frequency and urgency. Negative for vaginal bleeding, vaginal discharge and vaginal pain.  Musculoskeletal: Negative for back pain and myalgias.     Physical Exam Triage Vital Signs ED Triage Vitals  Enc Vitals Group     BP 06/22/20 1510 128/67     Pulse Rate 06/22/20 1510 91     Resp --      Temp --      Temp Source 06/22/20 1510 Oral     SpO2 06/22/20 1510 97 %     Weight 06/22/20 1506 171 lb (77.6 kg)     Height 06/22/20 1506 5\' 10"  (1.778 m)     Head Circumference --      Peak Flow --      Pain Score 06/22/20 1506 8     Pain Loc --      Pain Edu? --      Excl. in GC? --    No data found.  Updated Vital Signs BP 128/67 (BP Location: Right Arm)   Pulse 91   Temp 98.7 F (37.1 C) (Oral)   Ht 5\' 10"  (1.778 m)   Wt 171 lb (77.6 kg)   SpO2 97%   BMI 24.54 kg/m   Visual Acuity Right Eye Distance:   Left Eye Distance:   Bilateral Distance:    Right Eye Near:   Left Eye Near:    Bilateral Near:     Physical Exam Vitals and nursing note reviewed.  Constitutional:      General: She is not in acute distress.    Appearance: She is well-developed. She is not ill-appearing, toxic-appearing or diaphoretic.  HENT:     Head:  Normocephalic and atraumatic.  Cardiovascular:     Rate and Rhythm: Normal rate and regular rhythm.  Pulmonary:     Effort: Pulmonary effort is normal. No respiratory distress.     Breath sounds: Normal breath sounds. No stridor. No wheezing, rhonchi or rales.  Abdominal:  General: There is no distension.     Palpations: Abdomen is soft.     Tenderness: There is abdominal tenderness in the suprapubic area. There is no right CVA tenderness, left CVA tenderness, guarding or rebound. Negative signs include Murphy's sign and McBurney's sign.  Musculoskeletal:        General: Normal range of motion.     Cervical back: Normal range of motion.  Skin:    General: Skin is warm and dry.  Neurological:     Mental Status: She is alert and oriented to person, place, and time.  Psychiatric:        Behavior: Behavior normal.      UC Treatments / Results  Labs (all labs ordered are listed, but only abnormal results are displayed) Labs Reviewed  POCT URINALYSIS DIP (MANUAL ENTRY) - Abnormal; Notable for the following components:      Result Value   Blood, UA small (*)    All other components within normal limits    EKG   Radiology No results found.  Procedures Procedures (including critical care time)  Medications Ordered in UC Medications - No data to display  Initial Impression / Assessment and Plan / UC Course  I have reviewed the triage vital signs and the nursing notes.  Pertinent labs & imaging results that were available during my care of the patient were reviewed by me and considered in my medical decision making (see chart for details).    Hx and exam c/w renal stone Abdominal exam not concerning for acute abdomen. Encouraged close f/u with PCP and urology Discussed symptoms that warrant emergent care in the ED. AVS given  Final Clinical Impressions(s) / UC Diagnoses   Final diagnoses:  Right lower quadrant abdominal pain  Hematuria, microscopic  Right flank  pain  History of kidney stones     Discharge Instructions      You may take the medication as prescribed to help with urinary symptoms. Be sure to stay well hydrated. You may take 500mg  acetaminophen every 4-6 hours or in combination with ibuprofen 400-600mg  every 6-8 hours as needed for pain and inflammation.  Call to schedule a follow up appointment with primary care and/or urology next week for recheck of symptoms.   Call 911 or have someone drive you to the hospital if symptoms significantly worsening including worsening pain, unable to urinate, vomiting, fever, or other new concerning symptoms develop.     ED Prescriptions    Medication Sig Dispense Auth. Provider   tamsulosin (FLOMAX) 0.4 MG CAPS capsule Take 1 capsule (0.4 mg total) by mouth daily. 30 capsule , Lurene Shadow     PDMP not reviewed this encounter.     [place], PA-C 06/22/20 1700

## 2020-06-22 NOTE — ED Triage Notes (Signed)
Pt states that she has some abdominal pain that radiates to her right side. x1 day.

## 2021-01-07 ENCOUNTER — Emergency Department (HOSPITAL_BASED_OUTPATIENT_CLINIC_OR_DEPARTMENT_OTHER)
Admission: EM | Admit: 2021-01-07 | Discharge: 2021-01-07 | Disposition: A | Discharge status: Home / Self Care | Payer: 59 | Attending: Emergency Medicine | Admitting: Emergency Medicine

## 2021-01-07 ENCOUNTER — Encounter (HOSPITAL_BASED_OUTPATIENT_CLINIC_OR_DEPARTMENT_OTHER): Payer: Self-pay

## 2021-01-07 ENCOUNTER — Other Ambulatory Visit: Payer: Self-pay

## 2021-01-07 DIAGNOSIS — R110 Nausea: Secondary | ICD-10-CM | POA: Insufficient documentation

## 2021-01-07 DIAGNOSIS — F17210 Nicotine dependence, cigarettes, uncomplicated: Secondary | ICD-10-CM | POA: Diagnosis not present

## 2021-01-07 DIAGNOSIS — R350 Frequency of micturition: Secondary | ICD-10-CM | POA: Diagnosis not present

## 2021-01-07 DIAGNOSIS — Z87442 Personal history of urinary calculi: Secondary | ICD-10-CM | POA: Diagnosis not present

## 2021-01-07 DIAGNOSIS — R109 Unspecified abdominal pain: Secondary | ICD-10-CM | POA: Diagnosis present

## 2021-01-07 DIAGNOSIS — E039 Hypothyroidism, unspecified: Secondary | ICD-10-CM | POA: Insufficient documentation

## 2021-01-07 LAB — URINALYSIS, ROUTINE W REFLEX MICROSCOPIC
Bilirubin Urine: NEGATIVE
Glucose, UA: NEGATIVE mg/dL
Hgb urine dipstick: NEGATIVE
Ketones, ur: NEGATIVE mg/dL
Leukocytes,Ua: NEGATIVE
Nitrite: NEGATIVE
Protein, ur: NEGATIVE mg/dL
Specific Gravity, Urine: 1.005 (ref 1.005–1.030)
pH: 5 (ref 5.0–8.0)

## 2021-01-07 MED ORDER — TAMSULOSIN HCL 0.4 MG PO CAPS
0.4000 mg | ORAL_CAPSULE | Freq: Once | ORAL | Status: AC
  Administered 2021-01-07: 0.4 mg via ORAL
  Filled 2021-01-07: qty 1

## 2021-01-07 MED ORDER — IBUPROFEN 600 MG PO TABS: 600.0000 mg | ORAL_TABLET | Freq: Four times a day (QID) | ORAL | 0 refills | Status: AC | PRN

## 2021-01-07 MED ORDER — ONDANSETRON 4 MG PO TBDP: 4.0000 mg | ORAL_TABLET | Freq: Three times a day (TID) | ORAL | 0 refills | Status: AC | PRN

## 2021-01-07 MED ORDER — TAMSULOSIN HCL 0.4 MG PO CAPS: 0.4000 mg | ORAL_CAPSULE | Freq: Every day | ORAL | 0 refills | Status: AC

## 2021-01-07 MED ORDER — ONDANSETRON HCL 4 MG/2ML IJ SOLN
4.0000 mg | Freq: Once | INTRAMUSCULAR | Status: AC
  Administered 2021-01-07: 4 mg via INTRAVENOUS
  Filled 2021-01-07: qty 2

## 2021-01-07 MED ORDER — KETOROLAC TROMETHAMINE 15 MG/ML IJ SOLN
15.0000 mg | Freq: Once | INTRAMUSCULAR | Status: AC
  Administered 2021-01-07: 15 mg via INTRAVENOUS
  Filled 2021-01-07: qty 1

## 2021-01-07 NOTE — ED Triage Notes (Addendum)
Pt reports right flank pain  That goes to RLQ and nausea x tonight  -  Has hx of kidney stones   Pain score  7/ 10

## 2021-01-07 NOTE — ED Provider Notes (Signed)
[facility] EMERGENCY DEPT Provider Note  CSN: [id number] Arrival date & time: 01/07/21 0055  Chief Complaint(s) Flank Pain  HPI Diane Villegas is a 50 y.o. female here for right flank pain.  Onset/Duration: 8 hours Timing: constant/fluctuating Location/Radiation: Started initially in the right flank. Then started radiating to right groin a few hours later. Quality: aching/cramping Severity: moderate to severe Modifying Factors:  Improved by: nothing  Worsened by: nothing Associated Signs/Symptoms:  Pertinent (+): nausea, frequency (baseline per patient)  Pertinent (-): dysuria, emesis, diarrhea Context: h/o renal stone. Similar to prior episodes. Has had 3-4 in episodes in the last year.   HPI  Past Medical History Past Medical History:  Diagnosis Date  . History of hypothyroidism    Off meds  . History of meningitis   . IBS (irritable bowel syndrome)   . MDD (major depressive disorder)   . Migraine    Dr. Catalina Lunger at the [facility]  . OCD (obsessive compulsive disorder)   . Osteomyelitis [facility])    Patient Active Problem List   Diagnosis Date Noted  . Abdominal pain 06/22/2020  . Closed left subtrochanteric femur fracture (HCC) 08/29/2016  . Calculus of bile duct 04/23/2015  . Gastro-esophageal reflux disease without esophagitis 09/04/2014  . MDD (major depressive disorder)   . MDD (major depressive disorder), recurrent episode, moderate (HCC) 03/14/2014  . Inflammatory disorder of extremity 11/02/2013  . LUMBAR RADICULOPATHY, RIGHT 04/08/2010  . SCIATICA 03/21/2010  . CIGARETTE SMOKER 06/27/2009  . BACK PAIN, THORACIC REGION 04/01/2009  . HYPOTHYROIDISM 11/19/2008  . PREMATURE VENTRICULAR CONTRACTIONS 11/19/2008  . OCD (obsessive compulsive disorder) 06/19/2008  . MIGRAINE HEADACHE 06/19/2008   Home Medication(s) Prior to Admission medications   Medication Sig Start Date End Date Taking? Authorizing Provider  ibuprofen (ADVIL) 600 MG  tablet Take 1 tablet (600 mg total) by mouth every 6 (six) hours as needed. 01/07/21  Yes Axell Trigueros, Amadeo Garnet, MD  ondansetron (ZOFRAN ODT) 4 MG disintegrating tablet Take 1 tablet (4 mg total) by mouth every 8 (eight) hours as needed for up to 3 days for nausea or vomiting. 01/07/21 01/10/21 Yes Marvella Jenning, Amadeo Garnet, MD  tamsulosin (FLOMAX) 0.4 MG CAPS capsule Take 1 capsule (0.4 mg total) by mouth daily for 5 days. 01/07/21 01/12/21 Yes Mykel Mohl, Amadeo Garnet, MD  busPIRone (BUSPAR) 10 MG tablet Take 1 tablet (10 mg total) by mouth 2 (two) times daily. 10/31/15   Thresa Ross, MD  fluvoxaMINE (LUVOX) 100 MG tablet Take 3 tablets (300 mg total) by mouth at bedtime. 01/26/17   Fisher, Roselyn Bering, PA-C  methocarbamol (ROBAXIN) 750 MG tablet Take 1 tablet (750 mg total) by mouth every 8 (eight) hours as needed for muscle spasms. 12/02/17   McManama, Richardson Dopp, FNP  METRONIDAZOLE, TOPICAL, 0.75 % LOTN Apply 1 application topically 2 (two) times daily. 12/02/17   McManama, Richardson Dopp, FNP  topiramate (TOPAMAX) 200 MG tablet Take 1 tablet (200 mg total) by mouth daily. 01/26/17   Faythe Ghee, PA-C  Past Surgical History Past Surgical History:  Procedure Laterality Date  . BTL  2004  . CHOLECYSTECTOMY    . INTRAMEDULLARY (IM) NAIL INTERTROCHANTERIC Left 08/29/2016   Procedure: INTRAMEDULLARY (IM) NAIL INTERTROCHANTRIC;  Surgeon: Christena FlakeJohn J Poggi, MD;  Location: [facility] ORS;  Service: Orthopedics;  Laterality: Left;  . PARTIAL HYSTERECTOMY  2007   For prolapse  . THYROIDECTOMY, PARTIAL  1999  . TONSILLECTOMY     Family History Family History  Problem Relation Age of Onset  . Lymphoma Father   . Dementia Father   . Drug abuse Father   . Alcohol abuse Father   . Diabetes Mother   . Colon cancer Mother   . Arrhythmia Mother        Palpitations  . Depression Mother   . Anxiety  disorder Mother   . Arrhythmia Sister        Unknown type  . Depression Sister   . Anxiety disorder Sister   . Colon cancer Maternal Grandmother 60  . Alcohol abuse Brother   . Bipolar disorder Maternal Aunt   . Paranoid behavior Maternal Aunt   . Drug abuse Paternal Uncle   . Alcohol abuse Paternal Uncle   . Drug abuse Maternal Grandfather   . Alcohol abuse Maternal Grandfather   . Dementia Paternal Grandfather   . Dementia Paternal Grandmother     Social History Social History   Tobacco Use  . Smoking status: Current Every Day Smoker    Packs/day: 0.50    Years: 11.00    Pack years: 5.50    Types: Cigarettes  . Smokeless tobacco: Never Used  Substance Use Topics  . Alcohol use: Not Currently    Alcohol/week: 1.0 standard drink    Types: 1 Standard drinks or equivalent per week  . Drug use: No    Types: Cocaine, Marijuana, MDMA (Ecstacy)    Comment: Last use in her 3420s   Allergies Oxycodone, Cefazolin, Codeine, Moxifloxacin, and Tramadol hcl  Review of Systems Review of Systems All other systems are reviewed and are negative for acute change except as noted in the HPI  Physical Exam Vital Signs  I have reviewed the triage vital signs BP 106/69 (BP Location: Right Arm)   Pulse 66   Temp 97.7 F (36.5 C) (Oral)   Resp 18   Ht 5\' 10"  (1.778 m)   Wt 73.5 kg   SpO2 99%   BMI 23.24 kg/m   Physical Exam Vitals reviewed.  Constitutional:      General: She is not in acute distress.    Appearance: She is well-developed. She is not diaphoretic.  HENT:     Head: Normocephalic and atraumatic.     Right Ear: External ear normal.     Left Ear: External ear normal.     Nose: Nose normal.  Eyes:     General: No scleral icterus.    Conjunctiva/sclera: Conjunctivae normal.  Neck:     Trachea: Phonation normal.  Cardiovascular:     Rate and Rhythm: Normal rate and regular rhythm.  Pulmonary:     Effort: Pulmonary effort is normal. No respiratory distress.      Breath sounds: No stridor.  Abdominal:     General: There is no distension.     Tenderness: There is abdominal tenderness in the right lower quadrant. There is right CVA tenderness. There is no guarding or rebound. Negative signs include Murphy's sign and McBurney's sign.  Musculoskeletal:        General:  Normal range of motion.     Cervical back: Normal range of motion.  Neurological:     Mental Status: She is alert and oriented to person, place, and time.  Psychiatric:        Behavior: Behavior normal.     ED Results and Treatments Labs (all labs ordered are listed, but only abnormal results are displayed) Labs Reviewed  URINALYSIS, ROUTINE W REFLEX MICROSCOPIC - Abnormal; Notable for the following components:      Result Value   Color, Urine COLORLESS (*)    All other components within normal limits                                                                                                                         EKG  EKG Interpretation  Date/Time:    Ventricular Rate:    PR Interval:    QRS Duration:   QT Interval:    QTC Calculation:   R Axis:     Text Interpretation:        Radiology No results found.  Pertinent labs & imaging results that were available during my care of the patient were reviewed by me and considered in my medical decision making (see chart for details).  Medications Ordered in ED Medications  ketorolac (TORADOL) 15 MG/ML injection 15 mg (15 mg Intravenous Given 01/07/21 0141)  tamsulosin (FLOMAX) capsule 0.4 mg (0.4 mg Oral Given 01/07/21 0141)  ondansetron (ZOFRAN) injection 4 mg (4 mg Intravenous Given 01/07/21 0141)                                                                                                                                    Procedures Procedures  (including critical care time)  Medical Decision Making / ED Course I have reviewed the nursing notes for this encounter and the patient's prior records (if available in  EHR or on provided paperwork).   Diane Villegas was evaluated in Emergency Department on 01/07/2021 for the symptoms described in the history of present illness. She was evaluated in the context of the global COVID-19 pandemic, which necessitated consideration that the patient might be at risk for infection with the SARS-CoV-2 virus that causes COVID-19. Institutional protocols and algorithms that pertain to the evaluation of patients at risk for COVID-19 are in a state of rapid change based on information released by regulatory bodies including the CDC and federal  and state organizations. These policies and algorithms were followed during the patient's care in the ED.  Right flank pain. Similar to prior episodes of renal colic. Will treat as such and reassess.  UA ruled out infection. Low initial concern for appendicitis, torsion, ectopic (given age), colitis, or SBO.  After initial treatment, her pain was controlled. Patient comfortable and ready for discharge. Informed of the possible Motrin/Luvox interaction.      Final Clinical Impression(s) / ED Diagnoses Final diagnoses:  Right flank pain  History of renal stone   The patient appears reasonably screened and/or stabilized for discharge and I doubt any other medical condition or other [facility] requiring further screening, evaluation, or treatment in the ED at this time prior to discharge. Safe for discharge with strict return precautions.  Disposition: Discharge  Condition: Good  I have discussed the results, Dx and Tx plan with the patient/family who expressed understanding and agree(s) with the plan. Discharge instructions discussed at length. The patient/family was given strict return precautions who verbalized understanding of the instructions. No further questions at time of discharge.    ED Discharge Orders         Ordered    tamsulosin (FLOMAX) 0.4 MG CAPS capsule  Daily        01/07/21 0246    ondansetron (ZOFRAN ODT) 4 MG  disintegrating tablet  Every 8 hours PRN        01/07/21 0246    ibuprofen (ADVIL) 600 MG tablet  Every 6 hours PRN        01/07/21 0246         Discharge Instructions: Motrin and Luvox taken together may put you at risk for intestinal bleeding. Please use motrin only if pain is severe. You may also use tylenol for pain if needed.  Follow Up: Primary care provider  Call  as needed      This chart was dictated using voice recognition software.  Despite best efforts to proofread,  errors can occur which can change the documentation meaning.   Nira Conn, MD 01/07/21 [phone]

## 2021-01-07 NOTE — Discharge Instructions (Signed)
Motrin and Luvox taken together may put you at risk for intestinal bleeding. Please use motrin only if pain is severe. You may also use tylenol for pain if needed.
